# Patient Record
Sex: Female | Born: 1955 | Race: White | Hispanic: No | Marital: Married | Smoking: Never smoker
Health system: Southern US, Community
[De-identification: ages and names within clinical notes are randomized; demographics above are authoritative.]

## PROBLEM LIST (undated history)

## (undated) DIAGNOSIS — M1990 Unspecified osteoarthritis, unspecified site: Secondary | ICD-10-CM

## (undated) HISTORY — DX: Unspecified osteoarthritis, unspecified site: M19.90

---

## 1995-12-20 HISTORY — PX: BREAST BIOPSY: SHX20

## 2004-11-22 ENCOUNTER — Ambulatory Visit: Payer: Self-pay | Admitting: Internal Medicine

## 2004-11-22 IMAGING — MG MM CAD SCREENING MAMMO
1 series · 6 of 6 positions shown · non-contrast
Comparison: none

REASON FOR EXAM: screening
COMMENTS:

PROCEDURE:     MAM - MAM DGTL SCREENING MAMMO W/CAD  - [DATE] [DATE]
RESULT:     No dominant masses or pathologic clustered calcifications
demonstrated.   A stereotactic clip is present in the RIGHT breast.  Changes
consistent with scarring are noted on the RIGHT.

[R MLO · right · 6 of 6 slices shown]
[im 1/6]
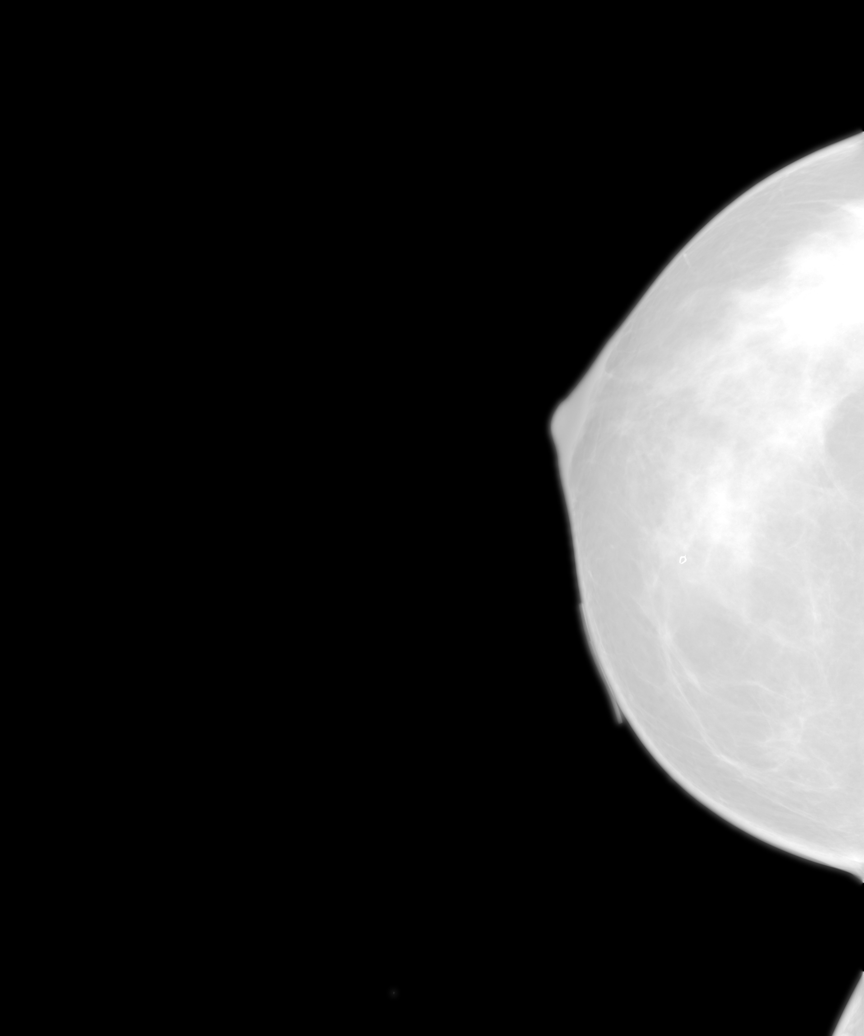
[im 2/6]
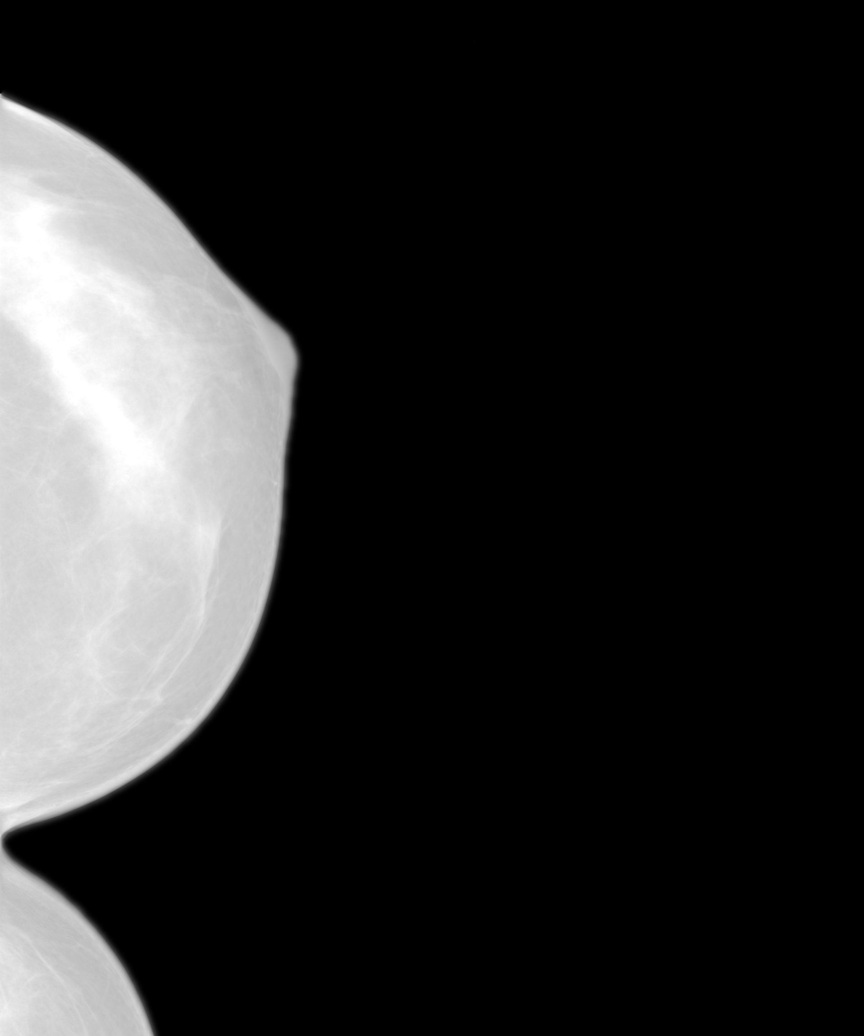
[im 3/6]
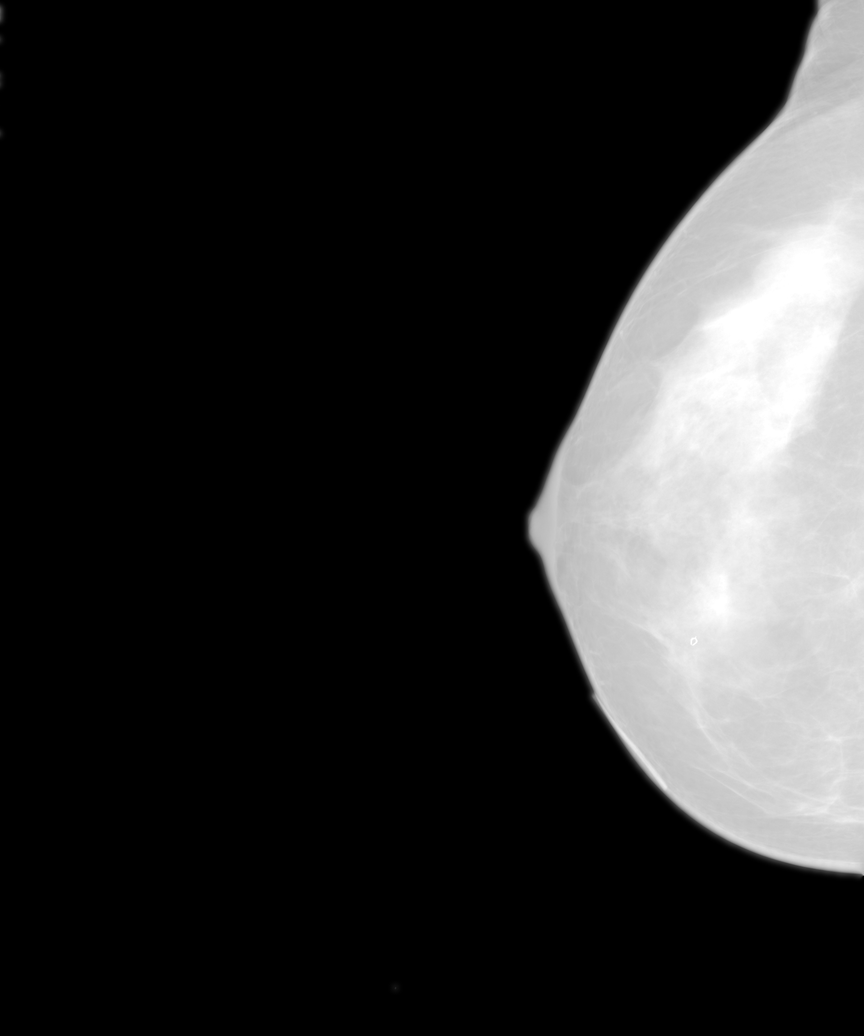
[im 4/6]
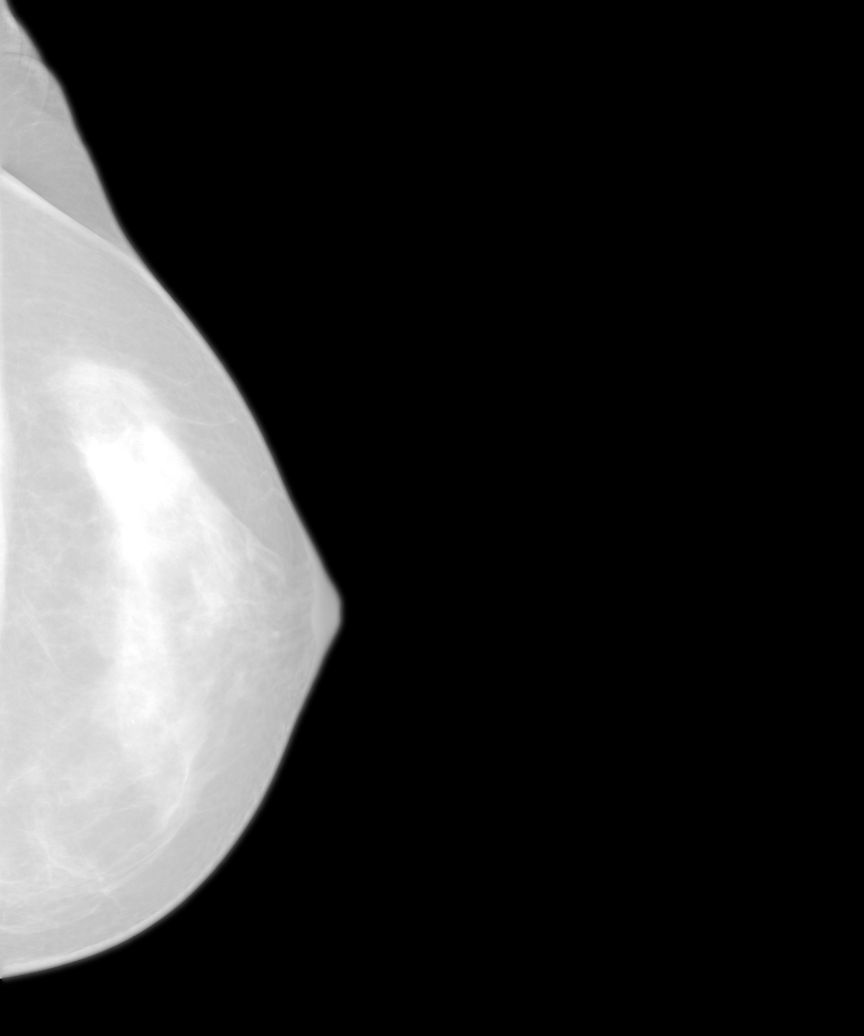
[im 5/6]
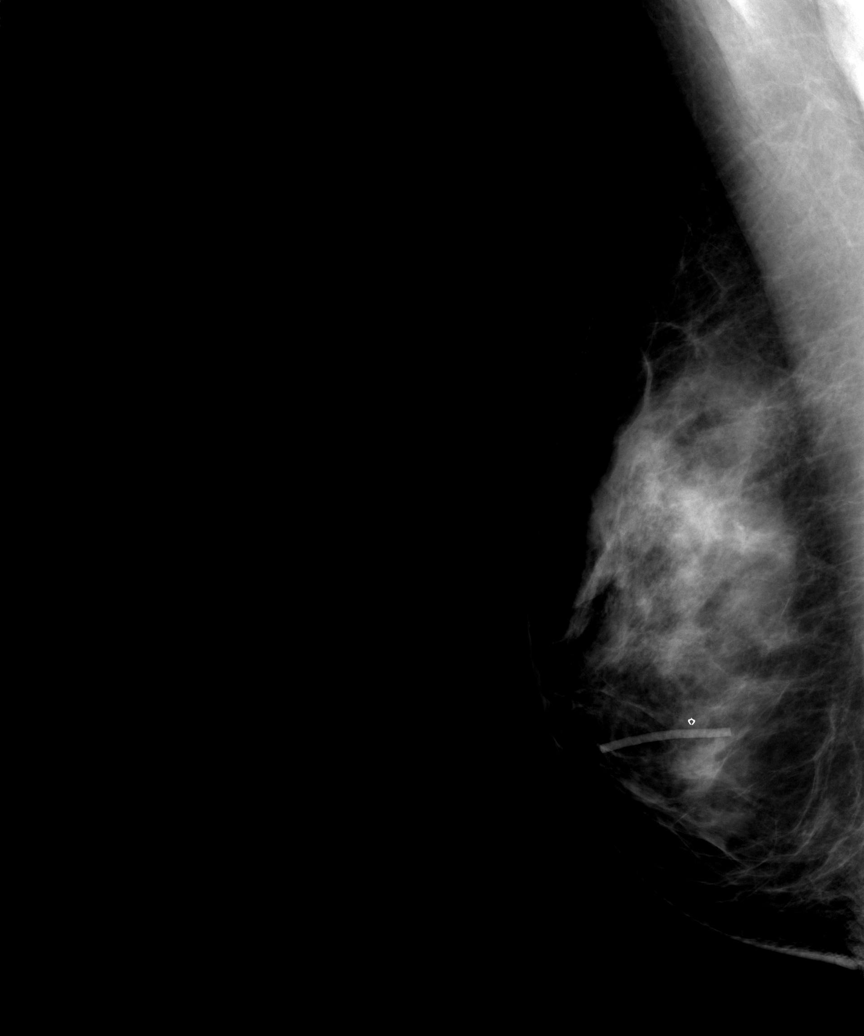
[im 6/6]
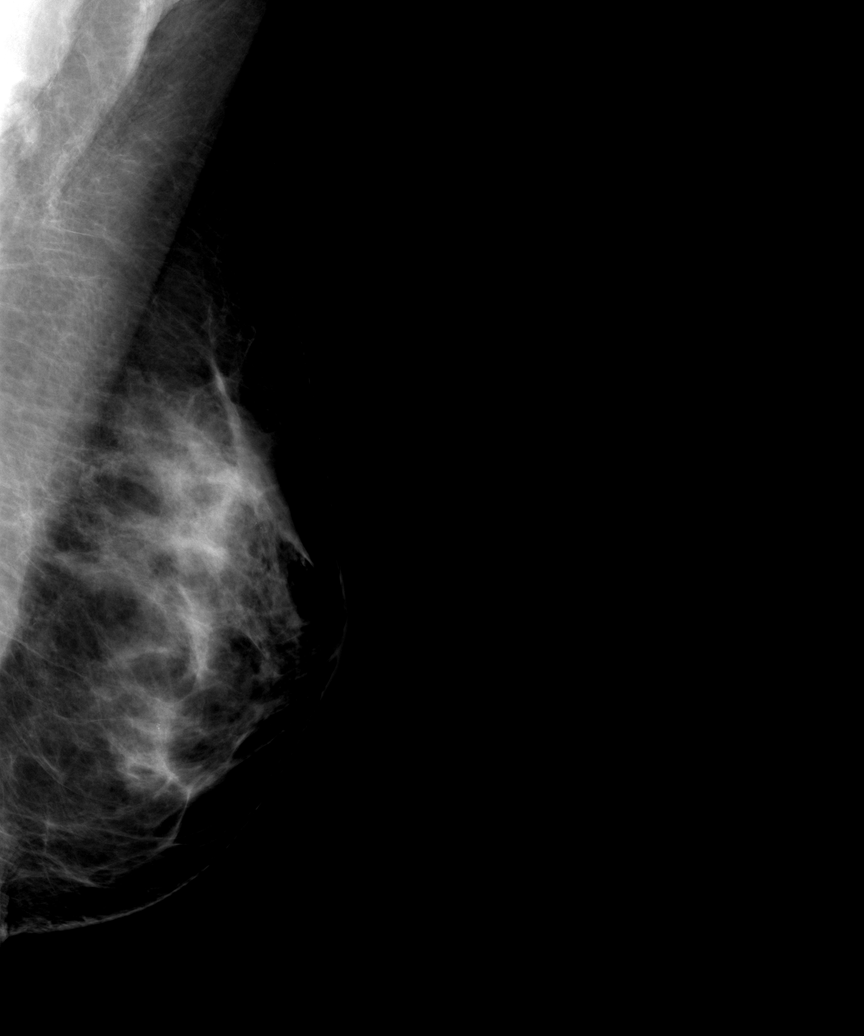

[6 of 6 positions shown; findings below may reference images not displayed]

IMPRESSION: 1)Benign exam.  Yearly follow-up mammogram suggested.

BI-RADS: Category 2-Benign Finding

A NEGATIVE MAMMOGRAM REPORT DOES NOT PRECLUDE BIOPSY OR OTHER EVALUATION OF
A CLINICALLY PALPABLE OR OTHERWISE SUSPICIOUS MASS OR LESION.  BREAST CANCER
MAY NOT BE DETECTED BY MAMMOGRAPHY IN UP TO 10% OF CASES.

## 2005-12-06 ENCOUNTER — Ambulatory Visit: Payer: Self-pay | Admitting: Internal Medicine

## 2005-12-08 ENCOUNTER — Ambulatory Visit: Payer: Self-pay | Admitting: Internal Medicine

## 2006-04-17 LAB — HM DEXA SCAN

## 2007-01-07 ENCOUNTER — Ambulatory Visit: Payer: Self-pay | Admitting: Internal Medicine

## 2008-01-21 ENCOUNTER — Ambulatory Visit: Payer: Self-pay | Admitting: Internal Medicine

## 2008-02-25 ENCOUNTER — Encounter: Admission: RE | Admit: 2008-02-25 | Discharge: 2008-02-25 | Payer: Self-pay | Admitting: Gastroenterology

## 2009-03-03 ENCOUNTER — Ambulatory Visit: Payer: Self-pay | Admitting: Internal Medicine

## 2010-04-14 ENCOUNTER — Ambulatory Visit: Payer: Self-pay | Admitting: Internal Medicine

## 2010-05-19 ENCOUNTER — Ambulatory Visit: Admit: 2010-05-19 | Payer: Self-pay | Admitting: Family Medicine

## 2010-05-19 ENCOUNTER — Ambulatory Visit: Payer: Self-pay | Admitting: Family Medicine

## 2010-05-31 ENCOUNTER — Encounter: Payer: Self-pay | Admitting: Family Medicine

## 2010-05-31 ENCOUNTER — Ambulatory Visit (INDEPENDENT_AMBULATORY_CARE_PROVIDER_SITE_OTHER): Payer: 59 | Admitting: Family Medicine

## 2010-05-31 ENCOUNTER — Other Ambulatory Visit: Payer: Self-pay | Admitting: Family Medicine

## 2010-05-31 DIAGNOSIS — M19049 Primary osteoarthritis, unspecified hand: Secondary | ICD-10-CM

## 2010-05-31 DIAGNOSIS — Z0000 Encounter for general adult medical examination without abnormal findings: Secondary | ICD-10-CM

## 2010-05-31 DIAGNOSIS — E785 Hyperlipidemia, unspecified: Secondary | ICD-10-CM

## 2010-05-31 DIAGNOSIS — Z23 Encounter for immunization: Secondary | ICD-10-CM

## 2010-05-31 LAB — LIPID PANEL
HDL: 59.3 mg/dL (ref >39.00)
Triglycerides: 127 mg/dL (ref 0.0–149.0)

## 2010-05-31 LAB — CBC WITH DIFFERENTIAL/PLATELET
Eosinophils Absolute: 0.1 10*3/uL (ref 0.0–0.7)
HCT: 37.5 % (ref 36.0–46.0)
Lymphocytes Relative: 47.8 % — ABNORMAL HIGH (ref 12.0–46.0)
Lymphs Abs: 2.4 10*3/uL (ref 0.7–4.0)
Monocytes Absolute: 0.4 10*3/uL (ref 0.1–1.0)
Monocytes Relative: 8.1 % (ref 3.0–12.0)
Neutrophils Relative %: 41.3 % — ABNORMAL LOW (ref 43.0–77.0)
RDW: 12.9 % (ref 11.5–14.6)

## 2010-05-31 LAB — BASIC METABOLIC PANEL
Calcium: 9.7 mg/dL (ref 8.4–10.5)
Chloride: 105 mEq/L (ref 96–112)
Potassium: 4.5 mEq/L (ref 3.5–5.1)
Sodium: 141 mEq/L (ref 135–145)

## 2010-05-31 LAB — LDL CHOLESTEROL, DIRECT: Direct LDL: 131.2 mg/dL

## 2010-05-31 LAB — TSH: TSH: 1.83 u[IU]/mL (ref 0.35–5.50)

## 2010-05-31 LAB — HEPATIC FUNCTION PANEL
AST: 32 U/L (ref 0–37)
Bilirubin, Direct: 0.1 mg/dL (ref 0.0–0.3)
Total Bilirubin: 0.6 mg/dL (ref 0.3–1.2)
Total Protein: 6.5 g/dL (ref 6.0–8.3)

## 2010-06-08 NOTE — Assessment & Plan Note (Signed)
Summary: pt to establish/dlo   Vital Signs:  Patient profile:   55 year old female Height:      62.75 inches Weight:      127.25 pounds BMI:     22.80 Temp:     98.3 degrees F oral Pulse rate:   72 / minute Pulse rhythm:   regular BP sitting:   116 / 76  (left arm) Cuff size:   regular  Vitals Entered By: Lewanda Rife LPN (May 31, 2010 11:13 AM) CC: New pt to establish   History of Present Illness: used to see Dr Jacquiline Doe in    was recommended here for primary care   has hx of osteoarthritis in her hands neg rheum workup in the past  is miserable at times -- generally does not take tylenol for it   no bp or chol problems  keeps her wt stable  last health mt exam was 2 full years ago   had a bone density test in past - ? petite frame  it was normal   just had her mam done in oct - all was good   ? last tetnus  is open to one     Preventive Screening-Counseling & Management  Alcohol-Tobacco     Smoking Status: never  Caffeine-Diet-Exercise     Does Patient Exercise: yes      Drug Use:  no.    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 - 2 kids live close by - has 4 grandkids  Never Smoked Alcohol use-no Drug use-no Regular exercise-yes- goes to the gym regularly   Risk Factors: Exercise: yes (05/31/2010)  Risk Factors: Smoking Status: never (05/31/2010)  Past Medical History: Arthritis  Past Surgical History: Denies surgical history  Family History: Maternal grandmother: arthritis mother - died at 6 of uterine cancer , MI - from chemo  father -- died of facial sinus cancer  no HTN or DM   Social History: Occupation:Insurance  Conservator, museum/gallery Married G2P2 - 2 kids live close by - has 4 grandkids  Never Smoked Alcohol use-no Drug use-no Regular exercise-yes- goes to the gym regularly  Occupation:  employed Smoking Status:  never Drug Use:  no Does Patient Exercise:  yes  Review of Systems General:  Denies fatigue, loss of appetite, and malaise. Eyes:  Denies blurring and eye irritation. CV:  Denies chest pain or discomfort, lightheadness, palpitations, and shortness of breath with exertion. Resp:  Denies cough, shortness of breath, and wheezing. GI:  Denies change in bowel habits, indigestion, and nausea. GU:  Denies dysuria and urinary frequency. MS:  Complains of joint pain and stiffness; denies cramps and muscle weakness. Derm:  Denies lesion(s), poor wound healing, and rash. Neuro:  Denies numbness and tingling. Endo:  Denies cold intolerance, excessive thirst, excessive urination, and heat intolerance. Heme:  Denies abnormal bruising and bleeding.  Physical Exam  General:  Well-developed,well-nourished,in no acute distress; alert,appropriate and cooperative throughout examination Head:  normocephalic, atraumatic, and no abnormalities observed.   Eyes:  vision grossly intact, pupils equal, pupils round, and pupils reactive to light.  no conjunctival pallor, injection or icterus  Mouth:  pharynx pink and moist.   Neck:  supple with full rom and  no masses or thyromegally, no JVD or carotid bruit  Lungs:  Normal respiratory effort, chest expands symmetrically. Lungs are clear to auscultation, no crackles or wheezes. Heart:  Normal rate and regular rhythm. S1 and S2 normal without gallop, murmur, click, rub or other extra sounds. Msk:  changes in distal pips of hands of OA with herberden's nodes nl romall fingers  Neurologic:  sensation intact to light touch, gait normal, and DTRs symmetrical and normal.   Skin:  Intact without suspicious lesions or rashes Cervical Nodes:  No lymphadenopathy noted Psych:   normal affect, talkative and pleasant    Impression & Recommendations:  Problem # 1:  OSTEOARTHRITIS, HANDS, BILATERAL (ICD-715.94) Assessment New with distal pip deformity and some pain  not enough to medicate disc overall plan for sympt tx including - activity/ heat/ tylenol and then nsaid if needed handout given from [facility] on OA can consider hand specialist ref at any time if worse  Problem # 2:  Preventive Health Care (ICD-V70.0) Assessment: Comment Only labs today for upcoming health mt exam   Complete Medication List: 1)  Calcium Carbonate-vitamin D 600-400 Mg-unit Tabs (Calcium carbonate-vitamin d) .... One tablet by mouth twice  a day 2)  Fish Oil 1000 Mg Caps (Omega-3 fatty acids) .... Take 1 capsule by mouth once a day 3)  Vitamin C 500 Mg Tabs (Ascorbic acid) .... One tablet by mouth twice  a day. 4)  Vitamin B Complex-c Caps (B complex-c) .... Take 1 capsule by mouth once a day 5)  Biotin ?mg  .... Otc as directed. 6)  Multivitamins Tabs (Multiple vitamin) .... Take 1 tablet by mouth once a day  Other Orders: Venipuncture (16109) TLB-Lipid Panel (80061-LIPID) TLB-BMP (Basic Metabolic Panel-BMET) (80048-METABOL) TLB-Hepatic/Liver Function Pnl (80076-HEPATIC) TLB-TSH (Thyroid Stimulating Hormone) (84443-TSH) TLB-CBC Platelet - w/Differential (85025-CBCD) TD Toxoids IM 7 YR + (60454) Admin 1st Vaccine (09811)  Patient Instructions: 1)  please send to Dr Park Breed for last labs/ note/ physical/ pap / mammogram/ dexa /colonoscopy 2)  the current recommendation for calcium intake is 1200-1500 mg daily with 1000 IU of vitamin D  3)  labs today for health maintentnce  4)  tetnus shot today  5)  please schedule physical (any 30 minute visit ) in the 1-2 months    Orders Added: 1)  Venipuncture [36415] 2)  TLB-Lipid Panel [80061-LIPID] 3)  TLB-BMP (Basic Metabolic Panel-BMET) [80048-METABOL] 4)  TLB-Hepatic/Liver Function Pnl [80076-HEPATIC] 5)  TLB-TSH (Thyroid  Stimulating Hormone) [84443-TSH] 6)  TLB-CBC Platelet - w/Differential [85025-CBCD] 7)  TD Toxoids IM 7 YR + [90714] 8)  Admin 1st Vaccine [90471] 9)  New Patient Level III [99203]   Immunizations Administered:  Tetanus Vaccine:    Vaccine Type: Td    Site: left deltoid    Mfr: Sanofi Pasteur    Dose: 0.5 ml    Route: IM    Given by: Lewanda Rife LPN    Exp. Date: 08/04/2011    Lot #: [id number]    VIS given: 03/04/08 version given May 31, 2010.   Immunizations Administered:  Tetanus Vaccine:    Vaccine Type: Td    Site: left deltoid    Mfr: Sanofi Pasteur    Dose: 0.5 ml    Route: IM    Given by: Lewanda Rife LPN    Exp. Date: 08/04/2011    Lot #: [id number]    VIS given: 03/04/08 version given May 31, 2010.  Current Allergies (reviewed today): No known allergies    Orders  Added: 1)  Venipuncture [36415] 2)  TLB-Lipid Panel [80061-LIPID] 3)  TLB-BMP (Basic Metabolic Panel-BMET) [80048-METABOL] 4)  TLB-Hepatic/Liver Function Pnl [80076-HEPATIC] 5)  TLB-TSH (Thyroid Stimulating Hormone) [84443-TSH] 6)  TLB-CBC Platelet - w/Differential [85025-CBCD] 7)  TD Toxoids IM 7 YR + [90714] 8)  Admin 1st Vaccine [90471] 9)  New Patient Level III [84132]    Preventive Care Screening  Mammogram:    Date:  01/19/2010    Results:  normal   Bone Density:    Date:  04/18/2007    Results:  normal std dev     nl colonoscopy at 49

## 2010-06-08 NOTE — Letter (Signed)
Summary: Patient Questionnaire  Patient Questionnaire   Imported By: Beau Fanny 05/31/2010 15:34:11  _____________________________________________________________________  External Attachment:    Type:   Image     Comment:   External Document

## 2010-06-16 ENCOUNTER — Encounter: Payer: Self-pay | Admitting: Family Medicine

## 2010-06-16 LAB — HM MAMMOGRAPHY

## 2010-07-12 ENCOUNTER — Encounter: Payer: Self-pay | Admitting: Family Medicine

## 2010-07-13 ENCOUNTER — Encounter: Payer: Self-pay | Admitting: Family Medicine

## 2010-07-14 ENCOUNTER — Encounter: Payer: Self-pay | Admitting: Family Medicine

## 2010-08-02 ENCOUNTER — Encounter: Payer: Self-pay | Admitting: Family Medicine

## 2010-08-02 ENCOUNTER — Ambulatory Visit (INDEPENDENT_AMBULATORY_CARE_PROVIDER_SITE_OTHER): Payer: 59 | Admitting: Family Medicine

## 2010-08-02 ENCOUNTER — Other Ambulatory Visit (HOSPITAL_COMMUNITY)
Admission: RE | Admit: 2010-08-02 | Discharge: 2010-08-02 | Disposition: A | Discharge status: Home / Self Care | Payer: 59 | Source: Ambulatory Visit | Attending: Family Medicine | Admitting: Family Medicine

## 2010-08-02 DIAGNOSIS — E7800 Pure hypercholesterolemia, unspecified: Secondary | ICD-10-CM | POA: Insufficient documentation

## 2010-08-02 DIAGNOSIS — Z0000 Encounter for general adult medical examination without abnormal findings: Secondary | ICD-10-CM

## 2010-08-02 DIAGNOSIS — Z1159 Encounter for screening for other viral diseases: Secondary | ICD-10-CM | POA: Insufficient documentation

## 2010-08-02 DIAGNOSIS — Z01419 Encounter for gynecological examination (general) (routine) without abnormal findings: Secondary | ICD-10-CM

## 2010-08-02 DIAGNOSIS — E785 Hyperlipidemia, unspecified: Secondary | ICD-10-CM

## 2010-08-02 LAB — HM PAP SMEAR: HM Pap smear: NORMAL

## 2010-08-02 NOTE — Assessment & Plan Note (Signed)
LDL 131 Will work on low sat fat diet - cut out so much cheese/ shellfish Re check at next PE

## 2010-08-02 NOTE — Assessment & Plan Note (Signed)
Reviewed health habits including diet and exercise and skin cancer prevention Also reviewed health mt list, fam hx and immunizations  Reviewed wellness labs in detail 

## 2010-08-02 NOTE — Patient Instructions (Addendum)
Avoid red meat/ fried foods/ egg yolks/ fatty breakfast meats/ butter, cheese and high fat dairy/ and shellfish  Keep up the good exercise Pap done today  Labs ok

## 2010-08-02 NOTE — Progress Notes (Signed)
  Subjective:    Patient ID: Erika Wall, female    DOB: Nov 06, 1955, 55 y.o.   MRN: [id number]  HPI Here for annual exam and to review chronic health problems  Is doing great --and fine   Wt is down 6 lb with bmi of 21--good Overall labs look good   Pap= has been about 3 ago  No abn paps , no new partners no hpv    Mam was 3/12  Self exam -- no lumps on self exam   colonosc neg 11/09- planned 10 y f/u  TD 2/12  Lipids this check LDL 131 Lab Results  Component Value Date   CHOL 216* 05/31/2010   Lab Results  Component Value Date   HDL 59.30 05/31/2010   No results found for this basename: LDLCALC   Lab Results  Component Value Date   TRIG 127.0 05/31/2010   Lab Results  Component Value Date   CHOLHDL 4 05/31/2010   Rev her diet - low sat fat , some nuts , too much cheese and some butter on bread     Review of Systems  Constitutional: Negative for activity change, appetite change, fatigue and unexpected weight change.  HENT: Negative for congestion and neck pain.   Eyes: Negative for pain and visual disturbance.  Respiratory: Negative for chest tightness and wheezing.   Cardiovascular: Negative.   Gastrointestinal: Negative for abdominal pain, diarrhea, constipation and blood in stool.  Genitourinary: Negative for urgency, frequency, vaginal bleeding, vaginal discharge and pelvic pain.  Musculoskeletal: Negative for myalgias and arthralgias.  Skin: Negative for color change, pallor, rash and wound.  Neurological: Negative for weakness, numbness and headaches.  Hematological: Negative for adenopathy. Does not bruise/bleed easily.  Psychiatric/Behavioral: Negative for dysphoric mood. The patient is not nervous/anxious.        Objective:   Physical Exam  Constitutional: She appears well-developed and well-nourished.  HENT:  Head: Normocephalic and atraumatic.  Right Ear: External ear normal.  Left Ear: External ear normal.  Nose: Nose normal.  Eyes:  Conjunctivae and EOM are normal. Pupils are equal, round, and reactive to light.  Neck: Normal range of motion. Neck supple. No JVD present. Carotid bruit is not present. No thyromegaly present.  Cardiovascular: Normal rate, regular rhythm and normal heart sounds.   Pulmonary/Chest: Effort normal and breath sounds normal.  Abdominal: Soft. Bowel sounds are normal. She exhibits no mass. There is no tenderness.  Genitourinary: Vagina normal and uterus normal. No breast swelling, tenderness, discharge or bleeding. No bleeding around the vagina. No vaginal discharge found.  Musculoskeletal: Normal range of motion. She exhibits no edema and no tenderness.  Lymphadenopathy:    She has no cervical adenopathy.  Neurological: She is alert. She has normal reflexes. Coordination normal.  Skin: Skin is warm and dry. No rash noted. No erythema. No pallor.  Psychiatric: She has a normal mood and affect.          Assessment & Plan:   Past Medical History  Diagnosis Date  . Arthritis    No past surgical history on file.  reports that she has never smoked. She does not have any smokeless tobacco history on file. She reports that she does not drink alcohol or use illicit drugs. family history includes Arthritis in her maternal grandmother; Cancer in her father; Cancer (age of onset:49) in her mother; and Stroke in her mother. No Known Allergies

## 2010-08-02 NOTE — Assessment & Plan Note (Signed)
Exam with pap done No complaints 

## 2011-10-31 ENCOUNTER — Encounter: Payer: Self-pay | Admitting: Family Medicine

## 2011-10-31 ENCOUNTER — Ambulatory Visit (INDEPENDENT_AMBULATORY_CARE_PROVIDER_SITE_OTHER): Payer: 59 | Admitting: Family Medicine

## 2011-10-31 VITALS — BP 120/68 | HR 71 | Temp 98.1°F | Ht 62.0 in | Wt 116.8 lb

## 2011-10-31 DIAGNOSIS — E785 Hyperlipidemia, unspecified: Secondary | ICD-10-CM

## 2011-10-31 DIAGNOSIS — M899 Disorder of bone, unspecified: Secondary | ICD-10-CM

## 2011-10-31 DIAGNOSIS — Z1231 Encounter for screening mammogram for malignant neoplasm of breast: Secondary | ICD-10-CM | POA: Insufficient documentation

## 2011-10-31 DIAGNOSIS — Z0000 Encounter for general adult medical examination without abnormal findings: Secondary | ICD-10-CM

## 2011-10-31 DIAGNOSIS — M8580 Other specified disorders of bone density and structure, unspecified site: Secondary | ICD-10-CM | POA: Insufficient documentation

## 2011-10-31 DIAGNOSIS — R300 Dysuria: Secondary | ICD-10-CM | POA: Insufficient documentation

## 2011-10-31 DIAGNOSIS — N390 Urinary tract infection, site not specified: Secondary | ICD-10-CM

## 2011-10-31 LAB — COMPREHENSIVE METABOLIC PANEL
AST: 25 U/L (ref 0–37)
Albumin: 4.4 g/dL (ref 3.5–5.2)
CO2: 29 mEq/L (ref 19–32)
Chloride: 103 mEq/L (ref 96–112)
GFR: 72.32 mL/min (ref >60.00)
Total Protein: 6.9 g/dL (ref 6.0–8.3)

## 2011-10-31 LAB — CBC WITH DIFFERENTIAL/PLATELET
Basophils Absolute: 0 10*3/uL (ref 0.0–0.1)
Eosinophils Absolute: 0.1 10*3/uL (ref 0.0–0.7)
Hemoglobin: 13.4 g/dL (ref 12.0–15.0)
Lymphocytes Relative: 41.8 % (ref 12.0–46.0)
Lymphs Abs: 2.1 10*3/uL (ref 0.7–4.0)
MCHC: 33.1 g/dL (ref 30.0–36.0)
MCV: 94.5 fl (ref 78.0–100.0)
Monocytes Absolute: 0.5 10*3/uL (ref 0.1–1.0)
Neutro Abs: 2.3 10*3/uL (ref 1.4–7.7)
Neutrophils Relative %: 45.9 % (ref 43.0–77.0)
RDW: 12.9 % (ref 11.5–14.6)
WBC: 5.1 10*3/uL (ref 4.5–10.5)

## 2011-10-31 LAB — POCT UA - MICROSCOPIC ONLY
WBC, Ur, HPF, POC: 0
Yeast, UA: 0

## 2011-10-31 LAB — POCT URINALYSIS DIPSTICK
Nitrite, UA: NEGATIVE
Protein, UA: NEGATIVE
pH, UA: 7

## 2011-10-31 LAB — LIPID PANEL
Cholesterol: 227 mg/dL — ABNORMAL HIGH (ref 0–200)
HDL: 70.1 mg/dL (ref >39.00)
Total CHOL/HDL Ratio: 3

## 2011-10-31 NOTE — Patient Instructions (Addendum)
We will schedule mammogram and dexa at check out  Labs today Keep up the good habits  Keep checking skin and wearing sun block

## 2011-10-31 NOTE — Assessment & Plan Note (Signed)
Scheduled annual screening mammogram Nl breast exam today  Encouraged monthly self exams   

## 2011-10-31 NOTE — Progress Notes (Signed)
Subjective:    Patient ID: Erika Wall, female    DOB: 1956-03-10, 56 y.o.   MRN: [id number]  HPI Here for health maintenance exam and to review chronic medical problems   Is doing well overall  Nothing new   Also urine c/o Gets a feeling occ like she is about to get uti - occ gets a azo  Just a little burning , and feels need to go often  No incontinence  ? If poss overactive bladder Drinks lots of fluids- water  occ coffee - that may cause problems  ua is negative today  Pap 4/12 normal  No hx of abn paps or problems  No ovarian cancer in family   Flu shot - did get in the fall  mammo 3/12-needs that at [facility] Self exam no lumps or changes   Colonoscopy 11/09- told her 10 year recall   dexa 08 with osteopenia  Wants to set that up at [facility]   Patient Active Problem List  Diagnosis  . OSTEOARTHRITIS, HANDS, BILATERAL  . Gynecological examination  . Routine general medical examination at a health care facility  . Hyperlipidemia, mild  . Other screening mammogram  . Osteopenia  . Dysuria   Past Medical History  Diagnosis Date  . Arthritis    No past surgical history on file. History  Substance Use Topics  . Smoking status: Never Smoker   . Smokeless tobacco: Not on file  . Alcohol Use: No   Family History  Problem Relation Age of Onset  . Cancer Mother 87    uterine  . Stroke Mother   . Cancer Father     facial sinus cancer  . Arthritis Maternal Grandmother    No Known Allergies Current Outpatient Prescriptions on File Prior to Visit  Medication Sig Dispense Refill  . Ascorbic Acid (VITAMIN C) 500 MG tablet Take 500 mg by mouth 2 (two) times daily.        Marland Kitchen b complex vitamins capsule Take 1 capsule by mouth daily.        . Biotin 10 MG TABS OTC as directed       . Calcium Carbonate-Vit D-Min 600-400 MG-UNIT TABS 1 tablet by mouth twice a day       . calcium-vitamin D (OSCAL WITH D 500-200) 500-200 MG-UNIT per tablet Take 1 tablet by mouth 2 (two)  times daily.        . fish oil-omega-3 fatty acids 1000 MG capsule Take 1 g by mouth daily.        . Multiple Vitamin (MULTIVITAMIN) capsule Take 1 capsule by mouth daily.        . Misc Natural Products (7-KETO LEAN PO) OTC as directed              Review of Systems Review of Systems  Constitutional: Negative for fever, appetite change, fatigue and unexpected weight change.  Eyes: Negative for pain and visual disturbance.  Respiratory: Negative for cough and shortness of breath.   Cardiovascular: Negative for cp or palpitations    Gastrointestinal: Negative for nausea, diarrhea and constipation.  Genitourinary: Negative for urgency and frequency. pos for occasional dysuria/ neg for blood in urine  Skin: Negative for pallor or rash   Neurological: Negative for weakness, light-headedness, numbness and headaches.  Hematological: Negative for adenopathy. Does not bruise/bleed easily.  Psychiatric/Behavioral: Negative for dysphoric mood. The patient is not nervous/anxious.         Objective:   Physical Exam  Constitutional:  She appears well-developed and well-nourished. No distress.  HENT:  Head: Normocephalic and atraumatic.  Right Ear: External ear normal.  Left Ear: External ear normal.  Nose: Nose normal.  Mouth/Throat: Oropharynx is clear and moist.  Eyes: Conjunctivae and EOM are normal. Pupils are equal, round, and reactive to light. No scleral icterus.  Neck: Normal range of motion. Neck supple. No JVD present. Carotid bruit is not present. No thyromegaly present.  Cardiovascular: Normal rate, regular rhythm, normal heart sounds and intact distal pulses.  Exam reveals no gallop.   Pulmonary/Chest: Effort normal and breath sounds normal. No respiratory distress. She has no wheezes.  Abdominal: Soft. Bowel sounds are normal. She exhibits no distension, no abdominal bruit and no mass. There is no tenderness.  Genitourinary: No breast swelling, tenderness, discharge or bleeding.        Breast exam: No mass, nodules, thickening, tenderness, bulging, retraction, inflamation, nipple discharge or skin changes noted.  No axillary or clavicular LA.  Chaperoned exam.    Musculoskeletal: Normal range of motion. She exhibits no edema and no tenderness.  Lymphadenopathy:    She has no cervical adenopathy.  Neurological: She is alert. She has normal reflexes. No cranial nerve deficit. She exhibits normal muscle tone. Coordination normal.  Skin: Skin is warm and dry. No rash noted. No erythema. No pallor.  Psychiatric: She has a normal mood and affect.          Assessment & Plan:

## 2011-10-31 NOTE — Assessment & Plan Note (Signed)
From dexa 2008 Disc ca and D and exercise- is a runner  No fractures Plan dexa

## 2011-10-31 NOTE — Assessment & Plan Note (Signed)
Reviewed health habits including diet and exercise and skin cancer prevention Also reviewed health mt list, fam hx and immunizations  Wellness labs today 

## 2011-10-31 NOTE — Assessment & Plan Note (Signed)
ua is negative  Disc cutting back coffee to see if that is causing bladder irritation Adv to f/u if not imp

## 2011-10-31 NOTE — Assessment & Plan Note (Signed)
Labs today- overall good diet

## 2011-11-01 LAB — TSH: TSH: 1.75 u[IU]/mL (ref 0.35–5.50)

## 2011-11-01 LAB — LDL CHOLESTEROL, DIRECT: Direct LDL: 119.2 mg/dL

## 2011-11-21 ENCOUNTER — Ambulatory Visit: Payer: 59

## 2011-11-21 ENCOUNTER — Other Ambulatory Visit: Payer: 59

## 2011-11-28 ENCOUNTER — Ambulatory Visit
Admission: RE | Admit: 2011-11-28 | Discharge: 2011-11-28 | Disposition: A | Discharge status: Home / Self Care | Payer: 59 | Source: Ambulatory Visit | Attending: Family Medicine | Admitting: Family Medicine

## 2011-11-28 DIAGNOSIS — M8580 Other specified disorders of bone density and structure, unspecified site: Secondary | ICD-10-CM

## 2011-11-28 DIAGNOSIS — Z1231 Encounter for screening mammogram for malignant neoplasm of breast: Secondary | ICD-10-CM

## 2011-12-22 ENCOUNTER — Other Ambulatory Visit: Payer: 59

## 2011-12-29 ENCOUNTER — Encounter: Payer: 59 | Admitting: Family Medicine

## 2012-04-18 ENCOUNTER — Ambulatory Visit: Payer: 59 | Admitting: Family Medicine

## 2013-01-07 ENCOUNTER — Encounter: Payer: Self-pay | Admitting: Internal Medicine

## 2013-01-07 ENCOUNTER — Ambulatory Visit (INDEPENDENT_AMBULATORY_CARE_PROVIDER_SITE_OTHER): Payer: Managed Care, Other (non HMO) | Admitting: Internal Medicine

## 2013-01-07 VITALS — BP 110/80 | HR 76 | Temp 98.4°F | Ht 62.5 in | Wt 119.5 lb

## 2013-01-07 DIAGNOSIS — M8580 Other specified disorders of bone density and structure, unspecified site: Secondary | ICD-10-CM

## 2013-01-07 DIAGNOSIS — E785 Hyperlipidemia, unspecified: Secondary | ICD-10-CM

## 2013-01-07 DIAGNOSIS — M899 Disorder of bone, unspecified: Secondary | ICD-10-CM

## 2013-01-07 MED ORDER — AMOXICILLIN 875 MG PO TABS: 875.0000 mg | ORAL_TABLET | Freq: Two times a day (BID) | ORAL | Status: DC

## 2013-01-07 MED ORDER — FLUTICASONE PROPIONATE 50 MCG/ACT NA SUSP: 2.0000 | Freq: Every day | NASAL | Status: DC

## 2013-01-07 NOTE — Patient Instructions (Signed)
flonase nasal spray - two sprays each nostril one time per day (in the evening).  Saline nasal spray - flush nose at least two times per day.  mucinex - one tablet in the am.  Robitussin - in the evening.

## 2013-01-11 ENCOUNTER — Encounter: Payer: Self-pay | Admitting: Internal Medicine

## 2013-01-11 NOTE — Progress Notes (Signed)
  Subjective:    Patient ID: Erika Wall, female    DOB: 09-11-1955, 57 y.o.   MRN: [id number]  HPI 57 year old female who comes in today to establish care.  Has previously been seeing Dr Milinda Antis.  States she is doing relatively well.  Has noticed recently having some sinus congestion.  Started last week.  No chest congestion.  No fever.  Head feels full.  Sore throat with increased drainage.  Has a history of some reoccurring sinus issues.  Taking mucinex, allegra and sudafed.  No sob.  Eating and drinking well.  No nausea or vomiting.  No bowel change.  LMP eight years ago.  All normal paps previously.  All normal mammograms.     Past Medical History  Diagnosis Date  . Arthritis     Current Outpatient Prescriptions on File Prior to Visit  Medication Sig Dispense Refill  . Ascorbic Acid (VITAMIN C) 500 MG tablet Take 500 mg by mouth 2 (two) times daily.        Marland Kitchen b complex vitamins capsule Take 1 capsule by mouth daily.        . Biotin 10 MG TABS OTC as directed       . calcium-vitamin D (OSCAL WITH D 500-200) 500-200 MG-UNIT per tablet Take 1 tablet by mouth 2 (two) times daily.        . fish oil-omega-3 fatty acids 1000 MG capsule Take 1 g by mouth daily.        . Multiple Vitamin (MULTIVITAMIN) capsule Take 1 capsule by mouth daily.         No current facility-administered medications on file prior to visit.    Review of Systems Patient denies any headache, lightheadedness or dizziness.  Does report some head fullness and sinus congestion as outlined.  Increased post nasal drainage and sore throat.  No chest pain, tightness or palpatations.  No increased shortness of breath, cough or congestion.  No nausea or vomiting.  No acid reflux.  No abdominal pain or cramping.  No bowel change, such as diarrhea, constipation, BRBPR or melana.  No urine change.  No vaginal problems.        Objective:   Physical Exam Filed Vitals:   01/07/13 1335  BP: 110/80  Pulse: 76  Temp: 98.4 F (29.31  C)   57 year old female in no acute distress.   HEENT:  Nares- clear.  Oropharynx - without lesions. NECK:  Supple.  Nontender.  No audible bruit.  HEART:  Appears to be regular. LUNGS:  No crackles or wheezing audible.  Respirations even and unlabored.  RADIAL PULSE:  Equal bilaterally. ABDOMEN:  Soft, nontender.  Bowel sounds present and normal.  No audible abdominal bruit.    EXTREMITIES:  No increased edema present.  DP pulses palpable and equal bilaterally.          Assessment & Plan:  PROBABLE SINUSITIS.  Treat with amoxicillin 875mg  bid x 10 days.  Saline nasal spray and Flonase as directed.  mucinex in the am and robitussin in the evening.  Stop the allegra temporarily.  Follow.   HEALTH MAINTENANCE.  Review records.  Get her set up for a full physical.  Discuss other health maintenance issues at next visit.    I spent 45 minutes with the patient and more than 50% of the time was spent in consultation regarding the above.

## 2013-01-11 NOTE — Assessment & Plan Note (Signed)
Noted on bone density 2008.  Follow.  Calcium and vitamin D and weight bearing exercise.

## 2013-01-11 NOTE — Assessment & Plan Note (Signed)
Low cholesterol diet and exercise.  Follow.   

## 2013-05-19 ENCOUNTER — Ambulatory Visit (INDEPENDENT_AMBULATORY_CARE_PROVIDER_SITE_OTHER): Payer: Managed Care, Other (non HMO) | Admitting: Internal Medicine

## 2013-05-19 ENCOUNTER — Encounter: Payer: Self-pay | Admitting: Internal Medicine

## 2013-05-19 VITALS — BP 130/90 | HR 66 | Temp 98.1°F | Ht 62.5 in | Wt 123.0 lb

## 2013-05-19 DIAGNOSIS — R300 Dysuria: Secondary | ICD-10-CM

## 2013-05-19 DIAGNOSIS — N390 Urinary tract infection, site not specified: Secondary | ICD-10-CM

## 2013-05-19 LAB — POCT URINALYSIS DIPSTICK
BILIRUBIN UA: NEGATIVE
Glucose, UA: NEGATIVE
Ketones, UA: NEGATIVE
NITRITE UA: NEGATIVE
PROTEIN UA: NEGATIVE
Spec Grav, UA: 1.01
Urobilinogen, UA: 0.2
pH, UA: 7

## 2013-05-19 MED ORDER — NITROFURANTOIN MONOHYD MACRO 100 MG PO CAPS: 100.0000 mg | ORAL_CAPSULE | Freq: Two times a day (BID) | ORAL | Status: DC

## 2013-05-19 NOTE — Progress Notes (Signed)
Pre-visit discussion using our clinic review tool. No additional management support is needed unless otherwise documented below in the visit note.  

## 2013-05-25 ENCOUNTER — Encounter: Payer: Self-pay | Admitting: Internal Medicine

## 2013-05-25 NOTE — Assessment & Plan Note (Signed)
Urinary symptoms as outlined.  Urine dip with trace blood and trace leukocytes.  Treat with macrobid as directed.  Await urine culture results.  Fluids.  Follow.  Notify me or be reevaluated if symptoms change, worsen or do not resolve.

## 2013-05-25 NOTE — Progress Notes (Signed)
  Subjective:    Patient ID: Janice NorrieLynne S Risinger, female    DOB: [birth year], 58 y.o.   MRN: [id number]  Urinary Tract Infection   58 year old female who comes in today as a work in with concerns regarding a urinary tract infection.  States symptoms started today.  Dysuria.  Symptoms at end urination.  Increased frequency.  No hematuria.  No vaginal symptoms.  No abdominal pain or cramping.  Some pressure.  No back pain.       Past Medical History  Diagnosis Date  . Arthritis     Current Outpatient Prescriptions on File Prior to Visit  Medication Sig Dispense Refill  . Ascorbic Acid (VITAMIN C) 500 MG tablet Take 500 mg by mouth 2 (two) times daily.        Marland Kitchen. b complex vitamins capsule Take 1 capsule by mouth daily.        . Biotin 10 MG TABS OTC as directed       . calcium-vitamin D (OSCAL WITH D 500-200) 500-200 MG-UNIT per tablet Take 1 tablet by mouth 2 (two) times daily.        . fexofenadine (ALLEGRA) 180 MG tablet Take 180 mg by mouth daily.      . fish oil-omega-3 fatty acids 1000 MG capsule Take 1 g by mouth daily.        . fluticasone (FLONASE) 50 MCG/ACT nasal spray Place 2 sprays into the nose daily.  16 g  1  . Multiple Vitamin (MULTIVITAMIN) capsule Take 1 capsule by mouth daily.         No current facility-administered medications on file prior to visit.    Review of Systems Urinary symptoms as outlined.  Eating and drinking well.  No nausea or vomiting.  No diarrhea or bowel change.  Increased frequency and dysuria.  No hematuria.  No back pain.  No significant abdominal pain or cramping.       Objective:   Physical Exam  Filed Vitals:   05/19/13 1625  BP: 130/90  Pulse: 66  Temp: 98.1 F (1736.847 C)   [age] year old female in no acute distress.  HEART:  Appears to be regular. LUNGS:  No crackles or wheezing audible.  Respirations even and unlabored. ABDOMEN:  Soft, nontender.  Bowel sounds present and normal.  No audible abdominal bruit.    BACK:  No tenderness to  palpation.  No CVA tenderness.            Assessment & Plan:

## 2013-08-07 ENCOUNTER — Encounter: Payer: Managed Care, Other (non HMO) | Admitting: Internal Medicine

## 2013-09-19 ENCOUNTER — Other Ambulatory Visit (INDEPENDENT_AMBULATORY_CARE_PROVIDER_SITE_OTHER): Payer: Managed Care, Other (non HMO)

## 2013-09-19 DIAGNOSIS — E785 Hyperlipidemia, unspecified: Secondary | ICD-10-CM

## 2013-09-19 DIAGNOSIS — M899 Disorder of bone, unspecified: Secondary | ICD-10-CM

## 2013-09-19 DIAGNOSIS — Z0000 Encounter for general adult medical examination without abnormal findings: Secondary | ICD-10-CM

## 2013-09-19 DIAGNOSIS — M949 Disorder of cartilage, unspecified: Secondary | ICD-10-CM

## 2013-09-19 LAB — COMPREHENSIVE METABOLIC PANEL
ALK PHOS: 49 U/L (ref 39–117)
ALT: 19 U/L (ref 0–35)
AST: 25 U/L (ref 0–37)
Albumin: 4.2 g/dL (ref 3.5–5.2)
BUN: 17 mg/dL (ref 6–23)
CO2: 29 meq/L (ref 19–32)
Calcium: 9.5 mg/dL (ref 8.4–10.5)
Chloride: 106 mEq/L (ref 96–112)
Creatinine, Ser: 0.8 mg/dL (ref 0.4–1.2)
GFR: 75.9 mL/min (ref >60.00)
GLUCOSE: 90 mg/dL (ref 70–99)
Potassium: 5.1 mEq/L (ref 3.5–5.1)
Sodium: 143 mEq/L (ref 135–145)
TOTAL PROTEIN: 6.5 g/dL (ref 6.0–8.3)
Total Bilirubin: 0.6 mg/dL (ref 0.2–1.2)

## 2013-09-19 LAB — CBC WITH DIFFERENTIAL/PLATELET
BASOS PCT: 0.6 % (ref 0.0–3.0)
Basophils Absolute: 0 10*3/uL (ref 0.0–0.1)
EOS ABS: 0.1 10*3/uL (ref 0.0–0.7)
Eosinophils Relative: 1 % (ref 0.0–5.0)
HCT: 39.8 % (ref 36.0–46.0)
Hemoglobin: 13.2 g/dL (ref 12.0–15.0)
LYMPHS ABS: 1.7 10*3/uL (ref 0.7–4.0)
Lymphocytes Relative: 25.4 % (ref 12.0–46.0)
MCHC: 33.2 g/dL (ref 30.0–36.0)
MCV: 95 fl (ref 78.0–100.0)
MONO ABS: 0.6 10*3/uL (ref 0.1–1.0)
MONOS PCT: 8.4 % (ref 3.0–12.0)
NEUTROS PCT: 64.6 % (ref 43.0–77.0)
Neutro Abs: 4.3 10*3/uL (ref 1.4–7.7)
Platelets: 248 10*3/uL (ref 150.0–400.0)
RBC: 4.19 Mil/uL (ref 3.87–5.11)
RDW: 13 % (ref 11.5–15.5)
WBC: 6.7 10*3/uL (ref 4.0–10.5)

## 2013-09-19 LAB — LIPID PANEL
CHOLESTEROL: 214 mg/dL — AB (ref 0–200)
HDL: 66.7 mg/dL (ref >39.00)
LDL Cholesterol: 131 mg/dL — ABNORMAL HIGH (ref 0–99)
NONHDL: 147.3
TRIGLYCERIDES: 82 mg/dL (ref 0.0–149.0)
Total CHOL/HDL Ratio: 3
VLDL: 16.4 mg/dL (ref 0.0–40.0)

## 2013-09-19 LAB — VITAMIN D 25 HYDROXY (VIT D DEFICIENCY, FRACTURES): VITD: 41.98 ng/mL

## 2013-09-23 ENCOUNTER — Other Ambulatory Visit: Payer: Managed Care, Other (non HMO)

## 2013-09-25 ENCOUNTER — Encounter: Payer: Self-pay | Admitting: Internal Medicine

## 2013-09-25 ENCOUNTER — Ambulatory Visit (INDEPENDENT_AMBULATORY_CARE_PROVIDER_SITE_OTHER): Payer: Managed Care, Other (non HMO) | Admitting: Internal Medicine

## 2013-09-25 ENCOUNTER — Other Ambulatory Visit (HOSPITAL_COMMUNITY)
Admission: RE | Admit: 2013-09-25 | Discharge: 2013-09-25 | Disposition: A | Discharge status: Home / Self Care | Payer: Managed Care, Other (non HMO) | Source: Ambulatory Visit | Attending: Internal Medicine | Admitting: Internal Medicine

## 2013-09-25 VITALS — BP 122/80 | HR 84 | Temp 99.3°F | Ht 63.0 in | Wt 123.5 lb

## 2013-09-25 DIAGNOSIS — Z1151 Encounter for screening for human papillomavirus (HPV): Secondary | ICD-10-CM | POA: Insufficient documentation

## 2013-09-25 DIAGNOSIS — Z124 Encounter for screening for malignant neoplasm of cervix: Secondary | ICD-10-CM

## 2013-09-25 DIAGNOSIS — M8580 Other specified disorders of bone density and structure, unspecified site: Secondary | ICD-10-CM

## 2013-09-25 DIAGNOSIS — W57XXXA Bitten or stung by nonvenomous insect and other nonvenomous arthropods, initial encounter: Secondary | ICD-10-CM

## 2013-09-25 DIAGNOSIS — M949 Disorder of cartilage, unspecified: Secondary | ICD-10-CM

## 2013-09-25 DIAGNOSIS — Z1231 Encounter for screening mammogram for malignant neoplasm of breast: Secondary | ICD-10-CM

## 2013-09-25 DIAGNOSIS — Z01419 Encounter for gynecological examination (general) (routine) without abnormal findings: Secondary | ICD-10-CM | POA: Insufficient documentation

## 2013-09-25 DIAGNOSIS — E875 Hyperkalemia: Secondary | ICD-10-CM

## 2013-09-25 DIAGNOSIS — M899 Disorder of bone, unspecified: Secondary | ICD-10-CM

## 2013-09-25 DIAGNOSIS — R509 Fever, unspecified: Secondary | ICD-10-CM

## 2013-09-25 DIAGNOSIS — R350 Frequency of micturition: Secondary | ICD-10-CM

## 2013-09-25 DIAGNOSIS — R0981 Nasal congestion: Secondary | ICD-10-CM

## 2013-09-25 DIAGNOSIS — T148 Other injury of unspecified body region: Secondary | ICD-10-CM

## 2013-09-25 DIAGNOSIS — E785 Hyperlipidemia, unspecified: Secondary | ICD-10-CM

## 2013-09-25 DIAGNOSIS — J3489 Other specified disorders of nose and nasal sinuses: Secondary | ICD-10-CM

## 2013-09-25 DIAGNOSIS — R300 Dysuria: Secondary | ICD-10-CM

## 2013-09-25 LAB — POCT URINALYSIS DIPSTICK
Bilirubin, UA: NEGATIVE
GLUCOSE UA: NEGATIVE
KETONES UA: NEGATIVE
NITRITE UA: NEGATIVE
Protein, UA: NEGATIVE
SPEC GRAV UA: 1.015
UROBILINOGEN UA: 0.2
pH, UA: 7

## 2013-09-25 LAB — CBC WITH DIFFERENTIAL/PLATELET
BASOS ABS: 0 10*3/uL (ref 0.0–0.1)
BASOS PCT: 0.4 % (ref 0.0–3.0)
EOS ABS: 0.1 10*3/uL (ref 0.0–0.7)
Eosinophils Relative: 0.6 % (ref 0.0–5.0)
HCT: 36.6 % (ref 36.0–46.0)
Hemoglobin: 12.4 g/dL (ref 12.0–15.0)
Lymphocytes Relative: 20.5 % (ref 12.0–46.0)
Lymphs Abs: 2 10*3/uL (ref 0.7–4.0)
MCHC: 33.7 g/dL (ref 30.0–36.0)
MCV: 92.7 fl (ref 78.0–100.0)
MONO ABS: 1.4 10*3/uL — AB (ref 0.1–1.0)
Monocytes Relative: 14.4 % — ABNORMAL HIGH (ref 3.0–12.0)
Neutro Abs: 6.1 10*3/uL (ref 1.4–7.7)
Neutrophils Relative %: 64.1 % (ref 43.0–77.0)
Platelets: 249 10*3/uL (ref 150.0–400.0)
RBC: 3.95 Mil/uL (ref 3.87–5.11)
RDW: 12.9 % (ref 11.5–15.5)
WBC: 9.5 10*3/uL (ref 4.0–10.5)

## 2013-09-25 LAB — POTASSIUM: Potassium: 4.3 mEq/L (ref 3.5–5.1)

## 2013-09-25 MED ORDER — DOXYCYCLINE HYCLATE 100 MG PO TABS: 100.0000 mg | ORAL_TABLET | Freq: Two times a day (BID) | ORAL | Status: DC

## 2013-09-25 NOTE — Progress Notes (Signed)
Pre visit review using our clinic review tool, if applicable. No additional management support is needed unless otherwise documented below in the visit note. 

## 2013-09-27 LAB — URINE CULTURE: Colony Count: >=100000

## 2013-09-29 ENCOUNTER — Encounter: Payer: Self-pay | Admitting: Internal Medicine

## 2013-09-29 NOTE — Progress Notes (Signed)
Subjective:    Patient ID: Erika NorrieLynne S Hupp, female    DOB: 09-Nov-1955, 58 y.o.   MRN: [id number]  Urinary Tract Infection   58 year old female who comes in today for her physical.  States she is doing relatively well.  States that 4 weeks ago, she removed a tick from her inner thigh.  She reports that over this past week, she has noticed some shaking chills, nausea and most recently developed some head congestion.  Her low back and hips ache.  Bilateral ear pain x 2 days.  Minimal chest congestion.   No sob.  Eating and drinking well.   No bowel change.  LMP eight years ago.  All normal paps previously.  All normal mammograms.  Also reports increased urinary frequency over the last two weeks.  .  Some odor change with minimal dysuria.  No hematuria.  No abdominal pain or cramping.     Past Medical History  Diagnosis Date  . Arthritis     Current Outpatient Prescriptions on File Prior to Visit  Medication Sig Dispense Refill  . Ascorbic Acid (VITAMIN C) 500 MG tablet Take 500 mg by mouth 2 (two) times daily.        Marland Kitchen. b complex vitamins capsule Take 1 capsule by mouth daily.        . calcium-vitamin D (OSCAL WITH D 500-200) 500-200 MG-UNIT per tablet Take 1 tablet by mouth 2 (two) times daily.        . fexofenadine (ALLEGRA) 180 MG tablet Take 180 mg by mouth as needed for allergies.       . fish oil-omega-3 fatty acids 1000 MG capsule Take 1 g by mouth daily.        . fluticasone (FLONASE) 50 MCG/ACT nasal spray Place 2 sprays into the nose daily.  16 g  1  . Multiple Vitamin (MULTIVITAMIN) capsule Take 1 capsule by mouth daily.         No current facility-administered medications on file prior to visit.    Review of Systems Patient denies any headache, lightheadedness or dizziness.  Does report some head fullness and sinus congestion as outlined.  Increased post nasal drainage and sore throat.  No chest pain, tightness or palpatations.  No increased shortness of breath, cough or congestion.   No nausea or vomiting.  No acid reflux.  No abdominal pain or cramping.  No bowel change, such as diarrhea, constipation, BRBPR or melana.  No urine change.  No vaginal problems.        Objective:   Physical Exam  Filed Vitals:   09/25/13 1001  BP: 122/80  Pulse: 84  Temp: 99.3 F (6737.1074 C)   58 year old female in no acute distress.   HEENT:  Nares- clear.  Oropharynx - without lesions. NECK:  Supple.  Nontender.  No audible bruit.  HEART:  Appears to be regular. LUNGS:  No crackles or wheezing audible.  Respirations even and unlabored.  RADIAL PULSE:  Equal bilaterally.    BREASTS:  No nipple discharge or nipple retraction present.  Could not appreciate any distinct nodules or axillary adenopathy.  ABDOMEN:  Soft, nontender.  Bowel sounds present and normal.  No audible abdominal bruit.  BACK:  Non tender.  No CVA tenderness.   GU:  Normal external genitalia.  Vaginal vault without lesions.  Cervix identified.  Pap performed. Could not appreciate any adnexal masses or tenderness.   RECTAL:  Heme negative.   EXTREMITIES:  No increased  edema present.  DP pulses palpable and equal bilaterally.          Assessment & Plan:  HEALTH MAINTENANCE.  Physical today.  Has mammogram scheduled for 7/15.     I spent 40 minutes with the patient and more than 50% of the time was spent in consultation regarding the above.

## 2013-09-30 ENCOUNTER — Encounter: Payer: Self-pay | Admitting: Internal Medicine

## 2013-09-30 LAB — CYTOLOGY - PAP

## 2013-09-30 NOTE — Assessment & Plan Note (Signed)
Noted on bone density 2008.  Follow.  Calcium and vitamin D and weight bearing exercise.  Will need f/u bone density.

## 2013-09-30 NOTE — Assessment & Plan Note (Signed)
Pelvic and pap today.  

## 2013-09-30 NOTE — Assessment & Plan Note (Signed)
Exam as outlined.  Urinalysis reveals a large amount of blood with large leukocytes.  Place on doxycycline as outlined.  Follow.

## 2013-09-30 NOTE — Assessment & Plan Note (Signed)
Has developed head congestion.  Taking mucinex.  Continue.  Saline nasal spray and nasacort as directed.  Being placed on doxycycline for possible tick infection, which should cover for possible sinus infection.

## 2013-09-30 NOTE — Assessment & Plan Note (Signed)
Has mammogram scheduled for 7/15.

## 2013-09-30 NOTE — Assessment & Plan Note (Signed)
Low cholesterol diet and exercise.  Follow.   

## 2013-09-30 NOTE — Assessment & Plan Note (Signed)
Removal of recent tick.  With fever, some aching and chills.  Will treat with doxycycline as outlined.  Should cover the other infections.  Follow.

## 2013-11-06 ENCOUNTER — Ambulatory Visit (INDEPENDENT_AMBULATORY_CARE_PROVIDER_SITE_OTHER): Payer: Managed Care, Other (non HMO) | Admitting: Internal Medicine

## 2013-11-06 ENCOUNTER — Encounter: Payer: Self-pay | Admitting: Internal Medicine

## 2013-11-06 VITALS — BP 110/70 | HR 62 | Temp 98.8°F | Ht 63.0 in | Wt 122.8 lb

## 2013-11-06 DIAGNOSIS — E785 Hyperlipidemia, unspecified: Secondary | ICD-10-CM

## 2013-11-06 DIAGNOSIS — R300 Dysuria: Secondary | ICD-10-CM

## 2013-11-06 DIAGNOSIS — R319 Hematuria, unspecified: Secondary | ICD-10-CM

## 2013-11-06 NOTE — Progress Notes (Signed)
Pre visit review using our clinic review tool, if applicable. No additional management support is needed unless otherwise documented below in the visit note. 

## 2013-11-07 LAB — URINALYSIS, ROUTINE W REFLEX MICROSCOPIC
BILIRUBIN URINE: NEGATIVE
Ketones, ur: NEGATIVE
Leukocytes, UA: NEGATIVE
NITRITE: NEGATIVE
Specific Gravity, Urine: <=1.005 — AB (ref 1.000–1.030)
TOTAL PROTEIN, URINE-UPE24: NEGATIVE
URINE GLUCOSE: NEGATIVE
UROBILINOGEN UA: 0.2 (ref 0.0–1.0)
WBC UA: NONE SEEN (ref 0–?)
pH: 6 (ref 5.0–8.0)

## 2013-11-09 ENCOUNTER — Encounter: Payer: Self-pay | Admitting: Internal Medicine

## 2013-11-09 NOTE — Assessment & Plan Note (Addendum)
No symptoms now.  Resolved.  Will recheck urinalysis to confirm clear.

## 2013-11-09 NOTE — Assessment & Plan Note (Signed)
Low cholesterol diet and exercise.  Follow.   

## 2013-11-09 NOTE — Progress Notes (Signed)
  Subjective:    Patient ID: Erika Wall, female    DOB: 03/18/1956, 58 y.o.   MRN: [id number]  HPI 58 year old female who comes in today for a scheduled follow up.   States she is doing better.   Previously was seen with concerns over UTI, respiratory infection and a tick bite.  See last note for details.  Treated with doxycycline.  Better now.  No urinary symptoms.  No increased cough or congestion.  Breathing is stable.  No sob.  No nausea or vomiting.  No bowel change.  Overall feels good.     Past Medical History  Diagnosis Date  . Arthritis     Current Outpatient Prescriptions on File Prior to Visit  Medication Sig Dispense Refill  . Ascorbic Acid (VITAMIN C) 500 MG tablet Take 500 mg by mouth 2 (two) times daily.        Marland Kitchen. b complex vitamins capsule Take 1 capsule by mouth daily.        . Biotin 1000 MCG tablet Take 1,000 mcg by mouth daily.      . calcium-vitamin D (OSCAL WITH D 500-200) 500-200 MG-UNIT per tablet Take 1 tablet by mouth 2 (two) times daily.        . fexofenadine (ALLEGRA) 180 MG tablet Take 180 mg by mouth as needed for allergies.       . fish oil-omega-3 fatty acids 1000 MG capsule Take 1 g by mouth daily.        . fluticasone (FLONASE) 50 MCG/ACT nasal spray Place 2 sprays into the nose daily.  16 g  1  . Multiple Vitamin (MULTIVITAMIN) capsule Take 1 capsule by mouth daily.         No current facility-administered medications on file prior to visit.    Review of Systems Patient denies any headache, lightheadedness or dizziness.  No increased congestion.  No chest pain, tightness or palpatations.  No increased shortness of breath, cough or congestion.  No nausea or vomiting.  No acid reflux.  No abdominal pain or cramping.  No bowel change, such as diarrhea, constipation, BRBPR or melana.  No urine change.  No vaginal problems.        Objective:   Physical Exam  Filed Vitals:   11/06/13 1401  BP: 110/70  Pulse: 62  Temp: 98.8 F (37.1 C)   58 year  old female in no acute distress.   HEENT:  Nares- clear.  Oropharynx - without lesions. NECK:  Supple.  Nontender.  No audible bruit.  HEART:  Appears to be regular. LUNGS:  No crackles or wheezing audible.  Respirations even and unlabored.  RADIAL PULSE:  Equal bilaterally. ABDOMEN:  Soft, nontender.  Bowel sounds present and normal.  No audible abdominal bruit.    EXTREMITIES:  No increased edema present.  DP pulses palpable and equal bilaterally.          Assessment & Plan:  HEALTH MAINTENANCE.  Physical 09/25/13.   She will reschedule her mammogram.

## 2013-11-14 ENCOUNTER — Ambulatory Visit: Payer: Managed Care, Other (non HMO) | Admitting: Internal Medicine

## 2014-06-05 ENCOUNTER — Encounter: Payer: Self-pay | Admitting: Family Medicine

## 2014-06-05 ENCOUNTER — Ambulatory Visit (INDEPENDENT_AMBULATORY_CARE_PROVIDER_SITE_OTHER): Payer: 59 | Admitting: Family Medicine

## 2014-06-05 VITALS — BP 126/82 | HR 105 | Temp 100.1°F | Wt 125.0 lb

## 2014-06-05 DIAGNOSIS — R059 Cough, unspecified: Secondary | ICD-10-CM

## 2014-06-05 DIAGNOSIS — R509 Fever, unspecified: Secondary | ICD-10-CM

## 2014-06-05 DIAGNOSIS — R05 Cough: Secondary | ICD-10-CM

## 2014-06-05 DIAGNOSIS — J209 Acute bronchitis, unspecified: Secondary | ICD-10-CM

## 2014-06-05 LAB — POCT INFLUENZA A/B
Influenza A, POC: NEGATIVE
Influenza B, POC: NEGATIVE

## 2014-06-05 MED ORDER — GUAIFENESIN-CODEINE 100-10 MG/5ML PO SYRP: 5.0000 mL | ORAL_SOLUTION | Freq: Every evening | ORAL | Status: DC | PRN

## 2014-06-05 MED ORDER — AZITHROMYCIN 250 MG PO TABS: ORAL_TABLET | ORAL | Status: DC

## 2014-06-05 MED ORDER — ALBUTEROL SULFATE HFA 108 (90 BASE) MCG/ACT IN AERS: 2.0000 | INHALATION_SPRAY | Freq: Four times a day (QID) | RESPIRATORY_TRACT | Status: DC | PRN

## 2014-06-05 NOTE — Progress Notes (Signed)
BP 126/82 mmHg  Pulse 105  Temp(Src) 100.1 F (37.8 C) (Oral)  Wt 125 lb (56.7 kg)  SpO2 94%  LMP 04/17/2005   CC: cough, fever  Subjective:    Patient ID: Janice NorrieLynne S Golberg, female    DOB: 1955-06-24, 59 y.o.   MRN: [id number]  HPI: Janice NorrieLynne S Hannig is a 59 y.o. female presenting on 06/05/2014 for Cough   Persistent cough over last week, rattly cough but not productive, associated with coughing fits, now painful to cough in upper chest, also with low grade fever.  Chest congestion ongoing over last week. Tmax Monday 101.3. Has not checked temp since then. Occasional dyspnea but mainly whet  No sore throat, ear or tooth pain sinus pressure or headaches. No body aches, myalgias, nausea.   Taking mucinex during the day as well as some prescription cough syrup at night time.  Husband has been sick as well recently. He is now feeling better. No h/o asthma or COPD. H/o bronchitis in past but not recently. No smokers at home. Did not receive flu shot this year.   Relevant past medical, surgical, family and social history reviewed and updated as indicated. Interim medical history since our last visit reviewed. Allergies and medications reviewed and updated. Current Outpatient Prescriptions on File Prior to Visit  Medication Sig  . Biotin 1000 MCG tablet Take 1,000 mcg by mouth daily.  . fluticasone (FLONASE) 50 MCG/ACT nasal spray Place 2 sprays into the nose daily.  . Multiple Vitamin (MULTIVITAMIN) capsule Take 1 capsule by mouth daily.    . Ascorbic Acid (VITAMIN C) 500 MG tablet Take 500 mg by mouth 2 (two) times daily.    Marland Kitchen. b complex vitamins capsule Take 1 capsule by mouth daily.    . calcium-vitamin D (OSCAL WITH D 500-200) 500-200 MG-UNIT per tablet Take 1 tablet by mouth 2 (two) times daily.    . fexofenadine (ALLEGRA) 180 MG tablet Take 180 mg by mouth as needed for allergies.   . fish oil-omega-3 fatty acids 1000 MG capsule Take 1 g by mouth daily.     No current  facility-administered medications on file prior to visit.    Review of Systems Per HPI unless specifically indicated above     Objective:    BP 126/82 mmHg  Pulse 105  Temp(Src) 100.1 F (37.8 C) (Oral)  Wt 125 lb (56.7 kg)  SpO2 94%  LMP 04/17/2005  Wt Readings from Last 3 Encounters:  06/05/14 125 lb (56.7 kg)  11/06/13 122 lb 12 oz (55.679 kg)  09/25/13 123 lb 8 oz (56.019 kg)    Physical Exam  Constitutional: She appears well-developed and well-nourished. No distress.  HENT:  Head: Normocephalic and atraumatic.  Right Ear: Hearing, tympanic membrane, external ear and ear canal normal.  Left Ear: Hearing, tympanic membrane, external ear and ear canal normal.  Nose: No mucosal edema or rhinorrhea. Right sinus exhibits no maxillary sinus tenderness and no frontal sinus tenderness. Left sinus exhibits no maxillary sinus tenderness and no frontal sinus tenderness.  Mouth/Throat: Uvula is midline, oropharynx is clear and moist and mucous membranes are normal. No oropharyngeal exudate, posterior oropharyngeal edema, posterior oropharyngeal erythema or tonsillar abscesses.  Eyes: Conjunctivae and EOM are normal. Pupils are equal, round, and reactive to light. No scleral icterus.  Neck: Normal range of motion. Neck supple.  Cardiovascular: Normal rate, regular rhythm, normal heart sounds and intact distal pulses.   No murmur heard. Pulmonary/Chest: Effort normal. No respiratory distress. She has  no decreased breath sounds. She has wheezes (polysymphonic diffusely). She has rhonchi (faint scattered). She has no rales.  Lymphadenopathy:    She has no cervical adenopathy.  Skin: Skin is warm and dry. No rash noted.  Nursing note and vitals reviewed.  Results for orders placed or performed in visit on 06/05/14  POCT Influenza A/B  Result Value Ref Range   Influenza A, POC Negative    Influenza B, POC Negative       Assessment & Plan:   Problem List Items Addressed This Visit      Acute bronchitis with bronchospasm - Primary    Treat with albuterol inhaler 2 puffs TID scheduled for next 2 days and zpack.  May use codeine cough syrup at night time. Push fluids and rest. Update Mon/Tues if not better for CXR and prednisone course. Discussed red flags to seek care over weekend. Pt agrees with plan.       Other Visit Diagnoses    Cough        Relevant Orders    POCT Influenza A/B (Completed)    Other specified fever        Relevant Orders    POCT Influenza A/B (Completed)        Follow up plan: Return if symptoms worsen or fail to improve.

## 2014-06-05 NOTE — Assessment & Plan Note (Signed)
Treat with albuterol inhaler 2 puffs TID scheduled for next 2 days and zpack.  May use codeine cough syrup at night time. Push fluids and rest. Update Mon/Tues if not better for CXR and prednisone course. Discussed red flags to seek care over weekend. Pt agrees with plan.

## 2014-06-05 NOTE — Patient Instructions (Signed)
I think you have reactive airway flare with bronchitis. Treat with zpack, albuterol inhaler, and codeine cough syrup at night time. May conitnue mucinex. If not improving as expected, update us on Mon or Tues for xray and possible prednisone course. Watch for fever> 101, or worsening productive cough.

## 2014-06-05 NOTE — Progress Notes (Signed)
Pre visit review using our clinic review tool, if applicable. No additional management support is needed unless otherwise documented below in the visit note. 

## 2014-08-25 ENCOUNTER — Other Ambulatory Visit: Payer: Self-pay

## 2014-08-25 DIAGNOSIS — Z1231 Encounter for screening mammogram for malignant neoplasm of breast: Secondary | ICD-10-CM

## 2014-09-08 ENCOUNTER — Ambulatory Visit
Admission: RE | Admit: 2014-09-08 | Discharge: 2014-09-08 | Disposition: A | Discharge status: Home / Self Care | Payer: 59 | Source: Ambulatory Visit | Attending: Internal Medicine | Admitting: Internal Medicine

## 2014-09-08 DIAGNOSIS — Z1231 Encounter for screening mammogram for malignant neoplasm of breast: Secondary | ICD-10-CM | POA: Diagnosis present

## 2014-09-09 ENCOUNTER — Encounter: Payer: Self-pay | Admitting: Internal Medicine

## 2014-09-09 DIAGNOSIS — Z0000 Encounter for general adult medical examination without abnormal findings: Secondary | ICD-10-CM | POA: Insufficient documentation

## 2014-10-08 ENCOUNTER — Encounter: Payer: Managed Care, Other (non HMO) | Admitting: Internal Medicine

## 2014-10-12 ENCOUNTER — Encounter: Payer: 59 | Admitting: Internal Medicine

## 2015-01-22 ENCOUNTER — Encounter: Payer: 59 | Admitting: Internal Medicine

## 2015-02-22 ENCOUNTER — Ambulatory Visit (INDEPENDENT_AMBULATORY_CARE_PROVIDER_SITE_OTHER): Payer: 59 | Admitting: Internal Medicine

## 2015-02-22 ENCOUNTER — Encounter: Payer: Self-pay | Admitting: Internal Medicine

## 2015-02-22 VITALS — BP 122/78 | HR 65 | Temp 98.1°F | Resp 18 | Ht 62.5 in | Wt 122.5 lb

## 2015-02-22 DIAGNOSIS — Z0000 Encounter for general adult medical examination without abnormal findings: Secondary | ICD-10-CM

## 2015-02-22 DIAGNOSIS — M8580 Other specified disorders of bone density and structure, unspecified site: Secondary | ICD-10-CM

## 2015-02-22 DIAGNOSIS — E785 Hyperlipidemia, unspecified: Secondary | ICD-10-CM

## 2015-02-22 NOTE — Assessment & Plan Note (Signed)
Last bone density 2013 - osteopenia.  Discussed weight bearing exercise.  Calcium and vitamin d.  Follow.

## 2015-02-22 NOTE — Assessment & Plan Note (Signed)
Physical today 02/22/15.  mammogram 09/09/14 - birads I.  PAP 09/25/13 - negative with negative HPV.  Colonoscopy 02/28/08 - normal.  Recommended f/u in 10 years.  IFOB.

## 2015-02-22 NOTE — Progress Notes (Signed)
Patient ID: Erika Wall, female   DOB: 26-Sep-1955, 59 y.o.   MRN: [id number]   Subjective:    Patient ID: Erika Wall, female    DOB: Mar 02, 1956, 60 y.o.   MRN: [id number]  HPI  Patient presents for her physical exam.  She tries to stay active.  We discussed diet and exercise.  She has adjusted her diet.  Bread bloats her.  When she avoids - no problems.  Bowels stable.  No cardiac symptoms with increased activity or exertion.  No sob.  No acid reflux.  No abdominal pain or cramping.     Past Medical History  Diagnosis Date  . Arthritis    Past Surgical History  Procedure Laterality Date  . Breast biopsy Right 12/20/1995   Family History  Problem Relation Age of Onset  . Cancer Mother 76    uterine  . Stroke Mother   . Cancer Father     facial sinus cancer  . Arthritis Maternal Grandmother    Social History   Social History  . Marital Status: Married    Spouse Name: N/A  . Number of Children: N/A  . Years of Education: N/A   Occupational History  . Marine scientist    Social History Main Topics  . Smoking status: Never Smoker   . Smokeless tobacco: Never Used  . Alcohol Use: No  . Drug Use: No  . Sexual Activity: Not Asked   Other Topics Concern  . None   Social History Narrative    Outpatient Encounter Prescriptions as of 02/22/2015  Medication Sig  . Biotin 1000 MCG tablet Take 1,000 mcg by mouth daily.  . fexofenadine (ALLEGRA) 180 MG tablet Take 180 mg by mouth as needed for allergies.   . fish oil-omega-3 fatty acids 1000 MG capsule Take 1 g by mouth daily.    . fluticasone (FLONASE) 50 MCG/ACT nasal spray Place 2 sprays into the nose daily.  . Multiple Vitamin (MULTIVITAMIN) capsule Take 1 capsule by mouth daily.    . [DISCONTINUED] albuterol (PROVENTIL HFA;VENTOLIN HFA) 108 (90 BASE) MCG/ACT inhaler Inhale 2 puffs into the lungs every 6 (six) hours as needed for wheezing or shortness of breath.  . [DISCONTINUED] Ascorbic Acid (VITAMIN C) 500 MG  tablet Take 500 mg by mouth 2 (two) times daily.    . [DISCONTINUED] azithromycin (ZITHROMAX Z-PAK) 250 MG tablet Two on day 1 followed by one daily for 4 days for total of 5 days, PO  . [DISCONTINUED] b complex vitamins capsule Take 1 capsule by mouth daily.    . [DISCONTINUED] calcium-vitamin D (OSCAL WITH D 500-200) 500-200 MG-UNIT per tablet Take 1 tablet by mouth 2 (two) times daily.    . [DISCONTINUED] guaiFENesin-codeine (ROBITUSSIN AC) 100-10 MG/5ML syrup Take 5 mLs by mouth at bedtime as needed.   No facility-administered encounter medications on file as of 02/22/2015.    Review of Systems  Constitutional: Negative for appetite change and unexpected weight change.  HENT: Negative for congestion and sinus pressure.   Eyes: Negative for pain and visual disturbance.  Respiratory: Negative for cough, chest tightness and shortness of breath.   Cardiovascular: Negative for chest pain, palpitations and leg swelling.  Gastrointestinal: Negative for nausea, vomiting, abdominal pain and diarrhea.  Genitourinary: Negative for dysuria and difficulty urinating.  Musculoskeletal: Negative for back pain and joint swelling.  Skin: Negative for color change and rash.  Neurological: Negative for dizziness and headaches.  Hematological: Negative for adenopathy. Does not bruise/bleed easily.  Psychiatric/Behavioral: Negative for dysphoric mood and agitation.       Objective:     Blood pressure rechecked by me:  126/78  Physical Exam  Constitutional: She is oriented to person, place, and time. She appears well-developed and well-nourished.  HENT:  Nose: Nose normal.  Mouth/Throat: Oropharynx is clear and moist.  Eyes: Right eye exhibits no discharge. Left eye exhibits no discharge. No scleral icterus.  Neck: Neck supple. No thyromegaly present.  Cardiovascular: Normal rate and regular rhythm.   Pulmonary/Chest: Breath sounds normal. No accessory muscle usage. No tachypnea. No respiratory  distress. She has no decreased breath sounds. She has no wheezes. She has no rhonchi. Right breast exhibits no inverted nipple, no mass, no nipple discharge and no tenderness (no axillary adenopathy). Left breast exhibits no inverted nipple, no mass, no nipple discharge and no tenderness (no axilarry adenopathy).  Abdominal: Soft. Bowel sounds are normal. There is no tenderness.  Genitourinary:  Not performed.   Musculoskeletal: She exhibits no edema or tenderness.  Lymphadenopathy:    She has no cervical adenopathy.  Neurological: She is alert and oriented to person, place, and time.  Skin: Skin is warm. No rash noted.  Psychiatric: She has a normal mood and affect. Her behavior is normal.    BP 122/78 mmHg  Pulse 65  Temp(Src) 98.1 F (36.7 C) (Oral)  Resp 18  Ht 5' 2.5" (1.588 m)  Wt 122 lb 8 oz (55.566 kg)  BMI 22.03 kg/m2  SpO2 95%  LMP 04/17/2005 Wt Readings from Last 3 Encounters:  02/22/15 122 lb 8 oz (55.566 kg)  06/05/14 125 lb (56.7 kg)  11/06/13 122 lb 12 oz (55.679 kg)     Lab Results  Component Value Date   WBC 9.5 09/25/2013   HGB 12.4 09/25/2013   HCT 36.6 09/25/2013   PLT 249.0 09/25/2013   GLUCOSE 90 09/19/2013   CHOL 214* 09/19/2013   TRIG 82.0 09/19/2013   HDL 66.70 09/19/2013   LDLDIRECT 119.2 10/31/2011   LDLCALC 131* 09/19/2013   ALT 19 09/19/2013   AST 25 09/19/2013   NA 143 09/19/2013   K 4.3 09/25/2013   CL 106 09/19/2013   CREATININE 0.8 09/19/2013   BUN 17 09/19/2013   CO2 29 09/19/2013   TSH 1.75 10/31/2011    Mm Digital Screening Bilateral  09/09/2014  CLINICAL DATA:  Screening. EXAM: DIGITAL SCREENING BILATERAL MAMMOGRAM WITH CAD COMPARISON:  Previous exam(s). ACR Breast Density Category c: The breast tissue is heterogeneously dense, which may obscure small masses. FINDINGS: There are no findings suspicious for malignancy. Images were processed with CAD. IMPRESSION: No mammographic evidence of malignancy. A result letter of this  screening mammogram will be mailed directly to the patient. RECOMMENDATION: Screening mammogram in one year. (Code:SM-B-01Y) BI-RADS CATEGORY  1: Negative. Electronically Signed   By: Dalphine HandingErin  Shaw M.D.   On: 09/09/2014 07:24       Assessment & Plan:   Problem List Items Addressed This Visit    Healthcare maintenance    Physical today 02/22/15.  mammogram 09/09/14 - birads I.  PAP 09/25/13 - negative with negative HPV.  Colonoscopy 02/28/08 - normal.  Recommended f/u in 10 years.  IFOB.       Hyperlipidemia, mild    Low cholesterol diet and exercise.  Follow lipid panel.        Relevant Orders   Comprehensive metabolic panel   Lipid panel   Osteopenia - Primary    Last bone density 2013 -  osteopenia.  Discussed weight bearing exercise.  Calcium and vitamin d.  Follow.        Relevant Orders   CBC with Differential/Platelet   TSH   Vit D  25 hydroxy (rtn osteoporosis monitoring)       Dale [place], MD

## 2015-02-22 NOTE — Progress Notes (Signed)
Pre-visit discussion using our clinic review tool. No additional management support is needed unless otherwise documented below in the visit note.  

## 2015-02-22 NOTE — Assessment & Plan Note (Signed)
Low cholesterol diet and exercise.  Follow lipid panel.   

## 2015-03-08 ENCOUNTER — Other Ambulatory Visit: Payer: 59

## 2015-03-12 ENCOUNTER — Other Ambulatory Visit (INDEPENDENT_AMBULATORY_CARE_PROVIDER_SITE_OTHER): Payer: 59

## 2015-03-12 DIAGNOSIS — Z139 Encounter for screening, unspecified: Secondary | ICD-10-CM

## 2015-03-15 ENCOUNTER — Other Ambulatory Visit: Payer: Self-pay | Admitting: *Deleted

## 2015-03-15 DIAGNOSIS — Z139 Encounter for screening, unspecified: Secondary | ICD-10-CM

## 2015-03-16 LAB — FECAL OCCULT BLOOD, IMMUNOCHEMICAL: FECAL OCCULT BLD: NEGATIVE

## 2015-03-17 ENCOUNTER — Encounter: Payer: Self-pay | Admitting: *Deleted

## 2015-04-19 ENCOUNTER — Other Ambulatory Visit: Payer: Self-pay | Admitting: Internal Medicine

## 2015-04-19 DIAGNOSIS — R319 Hematuria, unspecified: Secondary | ICD-10-CM

## 2015-04-19 NOTE — Progress Notes (Signed)
Order placed for f/u urinalysis 

## 2015-04-23 ENCOUNTER — Encounter: Payer: Self-pay | Admitting: *Deleted

## 2015-06-16 ENCOUNTER — Other Ambulatory Visit (INDEPENDENT_AMBULATORY_CARE_PROVIDER_SITE_OTHER): Payer: 59

## 2015-06-16 DIAGNOSIS — M8580 Other specified disorders of bone density and structure, unspecified site: Secondary | ICD-10-CM

## 2015-06-16 DIAGNOSIS — E785 Hyperlipidemia, unspecified: Secondary | ICD-10-CM | POA: Diagnosis not present

## 2015-06-16 DIAGNOSIS — R319 Hematuria, unspecified: Secondary | ICD-10-CM

## 2015-06-16 LAB — LIPID PANEL
Cholesterol: 237 mg/dL — ABNORMAL HIGH (ref 0–200)
HDL: 64.6 mg/dL (ref >39.00)
LDL CALC: 144 mg/dL — AB (ref 0–99)
NonHDL: 172.53
TRIGLYCERIDES: 143 mg/dL (ref 0.0–149.0)
Total CHOL/HDL Ratio: 4
VLDL: 28.6 mg/dL (ref 0.0–40.0)

## 2015-06-16 LAB — COMPREHENSIVE METABOLIC PANEL
ALT: 24 U/L (ref 0–35)
AST: 29 U/L (ref 0–37)
Albumin: 4.2 g/dL (ref 3.5–5.2)
Alkaline Phosphatase: 44 U/L (ref 39–117)
BUN: 18 mg/dL (ref 6–23)
CHLORIDE: 104 meq/L (ref 96–112)
CO2: 28 meq/L (ref 19–32)
CREATININE: 0.79 mg/dL (ref 0.40–1.20)
Calcium: 9.1 mg/dL (ref 8.4–10.5)
GFR: 78.77 mL/min (ref >60.00)
Glucose, Bld: 85 mg/dL (ref 70–99)
Potassium: 3.9 mEq/L (ref 3.5–5.1)
Sodium: 138 mEq/L (ref 135–145)
Total Bilirubin: 0.5 mg/dL (ref 0.2–1.2)
Total Protein: 6.6 g/dL (ref 6.0–8.3)

## 2015-06-16 LAB — CBC WITH DIFFERENTIAL/PLATELET
Basophils Absolute: 0 10*3/uL (ref 0.0–0.1)
Basophils Relative: 0.7 % (ref 0.0–3.0)
EOS ABS: 0.1 10*3/uL (ref 0.0–0.7)
Eosinophils Relative: 1.9 % (ref 0.0–5.0)
HEMATOCRIT: 38.1 % (ref 36.0–46.0)
Hemoglobin: 13 g/dL (ref 12.0–15.0)
LYMPHS ABS: 1.6 10*3/uL (ref 0.7–4.0)
LYMPHS PCT: 33.6 % (ref 12.0–46.0)
MCHC: 34.1 g/dL (ref 30.0–36.0)
MCV: 92.3 fl (ref 78.0–100.0)
Monocytes Absolute: 0.4 10*3/uL (ref 0.1–1.0)
Monocytes Relative: 9.3 % (ref 3.0–12.0)
NEUTROS ABS: 2.5 10*3/uL (ref 1.4–7.7)
NEUTROS PCT: 54.5 % (ref 43.0–77.0)
PLATELETS: 262 10*3/uL (ref 150.0–400.0)
RBC: 4.13 Mil/uL (ref 3.87–5.11)
RDW: 13.2 % (ref 11.5–15.5)
WBC: 4.6 10*3/uL (ref 4.0–10.5)

## 2015-06-16 LAB — TSH: TSH: 2.23 u[IU]/mL (ref 0.35–4.50)

## 2015-06-16 LAB — VITAMIN D 25 HYDROXY (VIT D DEFICIENCY, FRACTURES): VITD: 43.56 ng/mL (ref 30.00–100.00)

## 2016-02-22 ENCOUNTER — Encounter: Payer: 59 | Admitting: Internal Medicine

## 2016-03-23 ENCOUNTER — Other Ambulatory Visit: Payer: Self-pay | Admitting: Internal Medicine

## 2016-03-23 DIAGNOSIS — Z1231 Encounter for screening mammogram for malignant neoplasm of breast: Secondary | ICD-10-CM

## 2016-03-24 ENCOUNTER — Ambulatory Visit: Payer: 59 | Attending: Internal Medicine

## 2016-04-19 ENCOUNTER — Encounter: Payer: Self-pay | Admitting: Internal Medicine

## 2016-05-02 ENCOUNTER — Encounter: Payer: 59 | Admitting: Internal Medicine

## 2016-05-18 ENCOUNTER — Ambulatory Visit
Admission: RE | Admit: 2016-05-18 | Discharge: 2016-05-18 | Disposition: A | Discharge status: Home / Self Care | Payer: 59 | Source: Ambulatory Visit | Attending: Internal Medicine | Admitting: Internal Medicine

## 2016-05-18 DIAGNOSIS — Z1231 Encounter for screening mammogram for malignant neoplasm of breast: Secondary | ICD-10-CM | POA: Diagnosis not present

## 2016-09-25 ENCOUNTER — Encounter: Payer: Self-pay | Admitting: Internal Medicine

## 2016-09-25 ENCOUNTER — Ambulatory Visit (INDEPENDENT_AMBULATORY_CARE_PROVIDER_SITE_OTHER): Payer: 59 | Admitting: Internal Medicine

## 2016-09-25 ENCOUNTER — Other Ambulatory Visit (HOSPITAL_COMMUNITY)
Admission: RE | Admit: 2016-09-25 | Discharge: 2016-09-25 | Disposition: A | Discharge status: Home / Self Care | Payer: 59 | Source: Ambulatory Visit | Attending: Internal Medicine | Admitting: Internal Medicine

## 2016-09-25 VITALS — BP 120/76 | HR 91 | Temp 99.1°F | Resp 12 | Ht 61.81 in | Wt 125.2 lb

## 2016-09-25 DIAGNOSIS — E7800 Pure hypercholesterolemia, unspecified: Secondary | ICD-10-CM

## 2016-09-25 DIAGNOSIS — Z1211 Encounter for screening for malignant neoplasm of colon: Secondary | ICD-10-CM | POA: Diagnosis not present

## 2016-09-25 DIAGNOSIS — Z124 Encounter for screening for malignant neoplasm of cervix: Secondary | ICD-10-CM | POA: Insufficient documentation

## 2016-09-25 DIAGNOSIS — Z0000 Encounter for general adult medical examination without abnormal findings: Secondary | ICD-10-CM

## 2016-09-25 NOTE — Progress Notes (Signed)
Patient ID: Erika Wall, female   DOB: 1955/05/15, 61 y.o.   MRN: [id number]   Subjective:    Patient ID: Erika Wall, female    DOB: [birth year], 61 y.o.   MRN: [id number]  HPI  Patient here for her physical exam.  She reports she is doing well.  Feels good.  Stays active.  Exercises.  No chest pain.  No sob.  No acid reflux reported.  No abdominal pain or vaginal problems.  Bowels moving.  Overall feels good.  She has cut back her caffeine.     Past Medical History:  Diagnosis Date  . Arthritis    Past Surgical History:  Procedure Laterality Date  . BREAST BIOPSY Right 12/20/1995   Family History  Problem Relation Age of Onset  . Cancer Mother 52       uterine  . Stroke Mother   . Cancer Father        facial sinus cancer  . Arthritis Maternal Grandmother    Social History   Social History  . Marital status: Married    Spouse name: N/A  . Number of children: N/A  . Years of education: N/A   Occupational History  . Insurance [employer]   Social History Main Topics  . Smoking status: Never Smoker  . Smokeless tobacco: Never Used  . Alcohol use No  . Drug use: No  . Sexual activity: Not Asked   Other Topics Concern  . None   Social History Narrative  . None    Outpatient Encounter Prescriptions as of 09/25/2016  Medication Sig  . Biotin 1000 MCG tablet Take 1,000 mcg by mouth daily.  . fexofenadine (ALLEGRA) 180 MG tablet Take 180 mg by mouth as needed for allergies.   . fish oil-omega-3 fatty acids 1000 MG capsule Take 1 g by mouth daily.    . fluticasone (FLONASE) 50 MCG/ACT nasal spray Place 2 sprays into the nose daily.  . Multiple Vitamin (MULTIVITAMIN) capsule Take 1 capsule by mouth daily.     No facility-administered encounter medications on file as of 09/25/2016.     Review of Systems  Constitutional: Negative for appetite change and unexpected weight change.  HENT: Negative for congestion and sinus pressure.   Eyes: Negative  for pain and visual disturbance.  Respiratory: Negative for cough, chest tightness and shortness of breath.   Cardiovascular: Negative for chest pain, palpitations and leg swelling.  Gastrointestinal: Negative for abdominal pain, diarrhea, nausea and vomiting.  Genitourinary: Negative for difficulty urinating and dysuria.  Musculoskeletal: Negative for back pain and joint swelling.  Skin: Negative for color change and rash.  Neurological: Negative for dizziness, light-headedness and headaches.  Hematological: Negative for adenopathy. Does not bruise/bleed easily.  Psychiatric/Behavioral: Negative for agitation and dysphoric mood.       Objective:    Physical Exam  Constitutional: She is oriented to person, place, and time. She appears well-developed and well-nourished. No distress.  HENT:  Nose: Nose normal.  Mouth/Throat: Oropharynx is clear and moist.  Eyes: Right eye exhibits no discharge. Left eye exhibits no discharge. No scleral icterus.  Neck: Neck supple. No thyromegaly present.  Cardiovascular: Normal rate and regular rhythm.   Pulmonary/Chest: Breath sounds normal. No accessory muscle usage. No tachypnea. No respiratory distress. She has no decreased breath sounds. She has no wheezes. She has no rhonchi. Right breast exhibits no inverted nipple, no mass, no nipple discharge and no tenderness (no axillary adenopathy). Left breast exhibits no inverted  nipple, no mass, no nipple discharge and no tenderness (no axilarry adenopathy).  Abdominal: Soft. Bowel sounds are normal. There is no tenderness.  Genitourinary:  Genitourinary Comments: Normal external genitalia.  Vaginal vault without lesions.  Cervix identified.  Pap smear performed.  Could not appreciate any adnexal masses or tenderness.    Musculoskeletal: She exhibits no edema or tenderness.  Lymphadenopathy:    She has no cervical adenopathy.  Neurological: She is alert and oriented to person, place, and time.  Skin: Skin  is warm. No rash noted. No erythema.  Psychiatric: She has a normal mood and affect. Her behavior is normal.    BP 120/76 (BP Location: Left Arm, Patient Position: Sitting, Cuff Size: Normal)   Pulse 91   Temp 99.1 F (37.3 C) (Oral)   Resp 12   Ht 5' 1.81" (1.57 m)   Wt 125 lb 3.2 oz (56.8 kg)   LMP 04/17/2005   SpO2 98%   BMI 23.04 kg/m  Wt Readings from Last 3 Encounters:  09/25/16 125 lb 3.2 oz (56.8 kg)  02/22/15 122 lb 8 oz (55.6 kg)  06/05/14 125 lb (56.7 kg)     Lab Results  Component Value Date   WBC 4.6 06/16/2015   HGB 13.0 06/16/2015   HCT 38.1 06/16/2015   PLT 262.0 06/16/2015   GLUCOSE 85 06/16/2015   CHOL 237 (H) 06/16/2015   TRIG 143.0 06/16/2015   HDL 64.60 06/16/2015   LDLDIRECT 119.2 10/31/2011   LDLCALC 144 (H) 06/16/2015   ALT 24 06/16/2015   AST 29 06/16/2015   NA 138 06/16/2015   K 3.9 06/16/2015   CL 104 06/16/2015   CREATININE 0.79 06/16/2015   BUN 18 06/16/2015   CO2 28 06/16/2015   TSH 2.23 06/16/2015    Mm Screening Breast Tomo Bilateral  Result Date: 05/18/2016 CLINICAL DATA:  Screening. EXAM: 2D DIGITAL SCREENING BILATERAL MAMMOGRAM WITH CAD AND ADJUNCT TOMO COMPARISON:  Previous exam(s). ACR Breast Density Category c: The breast tissue is heterogeneously dense, which may obscure small masses. FINDINGS: There are no findings suspicious for malignancy. Images were processed with CAD. IMPRESSION: No mammographic evidence of malignancy. A result letter of this screening mammogram will be mailed directly to the patient. RECOMMENDATION: Screening mammogram in one year. (Code:SM-B-01Y) BI-RADS CATEGORY  1: Negative. Electronically Signed   By: Dalphine HandingErin  Shaw M.D.   On: 05/18/2016 18:05       Assessment & Plan:   Problem List Items Addressed This Visit    Healthcare maintenance    Physical today 09/25/16.  Mammogram 05/18/16 - Birads I.  PAP 09/25/16.  Colonoscopy 02/28/08 - normal.  Recommended f/u in 10 years.  IFOB.       Hypercholesterolemia     Exercises.  Watches her diet.  Check lipid panel.        Relevant Orders   CBC with Differential/Platelet   Comprehensive metabolic panel   TSH   Lipid panel    Other Visit Diagnoses    Colon cancer screening    -  Primary   Relevant Orders   Fecal occult blood, imunochemical       Dale DurhamSCOTT, Dameisha Tschida, MD

## 2016-09-25 NOTE — Assessment & Plan Note (Signed)
Exercises.  Watches her diet.  Check lipid panel.

## 2016-09-25 NOTE — Progress Notes (Signed)
Pre-visit discussion using our clinic review tool. No additional management support is needed unless otherwise documented below in the visit note.  

## 2016-09-25 NOTE — Assessment & Plan Note (Addendum)
Physical today 09/25/16.  Mammogram 05/18/16 - Birads I.  PAP 09/25/16.  Colonoscopy 02/28/08 - normal.  Recommended f/u in 10 years.  IFOB.

## 2016-09-25 NOTE — Addendum Note (Signed)
Addended by: Donnamarie PoagHOMPSON, Jonhatan Hearty Y on: 09/25/2016 09:50 AM   Modules accepted: Orders

## 2016-09-26 LAB — CYTOLOGY - PAP
Diagnosis: NEGATIVE
HPV (WINDOPATH): NOT DETECTED

## 2016-10-25 ENCOUNTER — Other Ambulatory Visit (INDEPENDENT_AMBULATORY_CARE_PROVIDER_SITE_OTHER): Payer: 59

## 2016-10-25 DIAGNOSIS — E7800 Pure hypercholesterolemia, unspecified: Secondary | ICD-10-CM | POA: Diagnosis not present

## 2016-10-25 LAB — COMPREHENSIVE METABOLIC PANEL
ALT: 13 U/L (ref 0–35)
AST: 20 U/L (ref 0–37)
Albumin: 4.4 g/dL (ref 3.5–5.2)
Alkaline Phosphatase: 49 U/L (ref 39–117)
BILIRUBIN TOTAL: 0.7 mg/dL (ref 0.2–1.2)
BUN: 16 mg/dL (ref 6–23)
CO2: 29 mEq/L (ref 19–32)
Calcium: 9.6 mg/dL (ref 8.4–10.5)
Chloride: 104 mEq/L (ref 96–112)
Creatinine, Ser: 0.94 mg/dL (ref 0.40–1.20)
GFR: 64.16 mL/min (ref >60.00)
GLUCOSE: 96 mg/dL (ref 70–99)
Potassium: 4.8 mEq/L (ref 3.5–5.1)
Sodium: 139 mEq/L (ref 135–145)
TOTAL PROTEIN: 6.8 g/dL (ref 6.0–8.3)

## 2016-10-25 LAB — CBC WITH DIFFERENTIAL/PLATELET
BASOS ABS: 0.1 10*3/uL (ref 0.0–0.1)
Basophils Relative: 1.1 % (ref 0.0–3.0)
Eosinophils Absolute: 0.2 10*3/uL (ref 0.0–0.7)
Eosinophils Relative: 2.9 % (ref 0.0–5.0)
HCT: 38.8 % (ref 36.0–46.0)
HEMOGLOBIN: 13.1 g/dL (ref 12.0–15.0)
LYMPHS PCT: 38.4 % (ref 12.0–46.0)
Lymphs Abs: 2.1 10*3/uL (ref 0.7–4.0)
MCHC: 33.7 g/dL (ref 30.0–36.0)
MCV: 93.8 fl (ref 78.0–100.0)
MONOS PCT: 8.5 % (ref 3.0–12.0)
Monocytes Absolute: 0.5 10*3/uL (ref 0.1–1.0)
NEUTROS PCT: 49.1 % (ref 43.0–77.0)
Neutro Abs: 2.6 10*3/uL (ref 1.4–7.7)
Platelets: 255 10*3/uL (ref 150.0–400.0)
RBC: 4.13 Mil/uL (ref 3.87–5.11)
RDW: 13.2 % (ref 11.5–15.5)
WBC: 5.4 10*3/uL (ref 4.0–10.5)

## 2016-10-25 LAB — LIPID PANEL
CHOL/HDL RATIO: 4
Cholesterol: 234 mg/dL — ABNORMAL HIGH (ref 0–200)
HDL: 63.7 mg/dL (ref >39.00)
LDL Cholesterol: 150 mg/dL — ABNORMAL HIGH (ref 0–99)
NonHDL: 169.83
Triglycerides: 99 mg/dL (ref 0.0–149.0)
VLDL: 19.8 mg/dL (ref 0.0–40.0)

## 2016-10-25 LAB — TSH: TSH: 2.68 u[IU]/mL (ref 0.35–4.50)

## 2016-11-01 ENCOUNTER — Other Ambulatory Visit (INDEPENDENT_AMBULATORY_CARE_PROVIDER_SITE_OTHER): Payer: 59

## 2016-11-01 ENCOUNTER — Encounter: Payer: Self-pay | Admitting: *Deleted

## 2016-11-01 DIAGNOSIS — Z1211 Encounter for screening for malignant neoplasm of colon: Secondary | ICD-10-CM | POA: Diagnosis not present

## 2016-11-01 LAB — FECAL OCCULT BLOOD, IMMUNOCHEMICAL: Fecal Occult Bld: NEGATIVE

## 2016-11-02 ENCOUNTER — Telehealth: Payer: Self-pay | Admitting: Internal Medicine

## 2016-11-02 NOTE — Telephone Encounter (Signed)
Spoke to patient see lab results.  

## 2016-11-02 NOTE — Telephone Encounter (Signed)
Pt called back returning your call.   Call pt @ [phone]. Thank you!

## 2017-09-17 ENCOUNTER — Other Ambulatory Visit: Payer: Self-pay | Admitting: Internal Medicine

## 2017-09-17 DIAGNOSIS — Z1231 Encounter for screening mammogram for malignant neoplasm of breast: Secondary | ICD-10-CM

## 2017-09-27 ENCOUNTER — Ambulatory Visit (INDEPENDENT_AMBULATORY_CARE_PROVIDER_SITE_OTHER): Payer: Managed Care, Other (non HMO) | Admitting: Internal Medicine

## 2017-09-27 ENCOUNTER — Other Ambulatory Visit (HOSPITAL_COMMUNITY)
Admission: RE | Admit: 2017-09-27 | Discharge: 2017-09-27 | Disposition: A | Discharge status: Home / Self Care | Payer: Managed Care, Other (non HMO) | Source: Ambulatory Visit | Attending: Internal Medicine | Admitting: Internal Medicine

## 2017-09-27 ENCOUNTER — Encounter: Payer: Self-pay | Admitting: Internal Medicine

## 2017-09-27 DIAGNOSIS — Z124 Encounter for screening for malignant neoplasm of cervix: Secondary | ICD-10-CM | POA: Insufficient documentation

## 2017-09-27 DIAGNOSIS — E7800 Pure hypercholesterolemia, unspecified: Secondary | ICD-10-CM

## 2017-09-27 DIAGNOSIS — Z1211 Encounter for screening for malignant neoplasm of colon: Secondary | ICD-10-CM | POA: Diagnosis not present

## 2017-09-27 DIAGNOSIS — Z0000 Encounter for general adult medical examination without abnormal findings: Secondary | ICD-10-CM

## 2017-09-27 NOTE — Assessment & Plan Note (Addendum)
Physical today 09/27/17.  Mammogram scheduled for 10/02/17.  PAP 09/25/16 - atrophy.  Negative HPV.  Satisfactory for evaluation.  Repeat pap today.  Colonoscopy 02/28/08 - normal.  Agreeable to colonoscopy.   Recommended f/u in 10 years.  Discussed shingrix.

## 2017-09-27 NOTE — Progress Notes (Signed)
Patient ID: Erika Wall, female   DOB: [birth year], 62 y.o.   MRN: [id number]   Subjective:    Patient ID: Erika Wall, female    DOB: Sep 28, 1955, 62 y.o.   MRN: [id number]  HPI  Patient here for her physical exam.  She reports she is doing well.  Stays active.  No chest pain.  No sob.  Watching her diet.  No acid reflux.  No abdominal pain.  Bowels moving.  No urine change.  Discussed the need for colonoscopy.  She is in agreement for referral.  Discussed shingrix.     Past Medical History:  Diagnosis Date  . Arthritis    Past Surgical History:  Procedure Laterality Date  . BREAST BIOPSY Right 12/20/1995   Family History  Problem Relation Age of Onset  . Cancer Mother 89       uterine  . Stroke Mother   . Cancer Father        facial sinus cancer  . Arthritis Maternal Grandmother    Social History   Socioeconomic History  . Marital status: Married    Spouse name: Not on file  . Number of children: Not on file  . Years of education: Not on file  . Highest education level: Not on file  Occupational History  . Occupation: Advertising account planner: [employer]  Social Needs  . Financial resource strain: Not on file  . Food insecurity:    Worry: Not on file    Inability: Not on file  . Transportation needs:    Medical: Not on file    Non-medical: Not on file  Tobacco Use  . Smoking status: Never Smoker  . Smokeless tobacco: Never Used  Substance and Sexual Activity  . Alcohol use: No    Alcohol/week: 0.0 oz  . Drug use: No  . Sexual activity: Not on file  Lifestyle  . Physical activity:    Days per week: Not on file    Minutes per session: Not on file  . Stress: Not on file  Relationships  . Social connections:    Talks on phone: Not on file    Gets together: Not on file    Attends religious service: Not on file    Active member of club or organization: Not on file    Attends meetings of clubs or organizations: Not on file    Relationship  status: Not on file  Other Topics Concern  . Not on file  Social History Narrative  . Not on file    Outpatient Encounter Medications as of 09/27/2017  Medication Sig  . triamcinolone (NASACORT) 55 MCG/ACT AERO nasal inhaler Place 2 sprays into the nose daily as needed.  . Biotin 1000 MCG tablet Take 1,000 mcg by mouth daily.  . fexofenadine (ALLEGRA) 180 MG tablet Take 180 mg by mouth as needed for allergies.   . fish oil-omega-3 fatty acids 1000 MG capsule Take 1 g by mouth daily.    . Multiple Vitamin (MULTIVITAMIN) capsule Take 1 capsule by mouth daily.    . [DISCONTINUED] fluticasone (FLONASE) 50 MCG/ACT nasal spray Place 2 sprays into the nose daily.   No facility-administered encounter medications on file as of 09/27/2017.     Review of Systems  Constitutional: Negative for appetite change and unexpected weight change.  HENT: Negative for congestion and sinus pressure.   Eyes: Negative for pain and visual disturbance.  Respiratory: Negative for cough, chest tightness and shortness of breath.  Cardiovascular: Negative for chest pain, palpitations and leg swelling.  Gastrointestinal: Negative for abdominal pain, diarrhea, nausea and vomiting.  Genitourinary: Negative for difficulty urinating and dysuria.  Musculoskeletal: Negative for joint swelling and myalgias.  Skin: Negative for color change and rash.  Neurological: Negative for dizziness, light-headedness and headaches.  Hematological: Negative for adenopathy. Does not bruise/bleed easily.  Psychiatric/Behavioral: Negative for decreased concentration and dysphoric mood.       Objective:    Physical Exam  Constitutional: She is oriented to person, place, and time. She appears well-developed and well-nourished. No distress.  HENT:  Nose: Nose normal.  Mouth/Throat: Oropharynx is clear and moist.  Eyes: Right eye exhibits no discharge. Left eye exhibits no discharge. No scleral icterus.  Neck: Neck supple. No  thyromegaly present.  Cardiovascular: Normal rate and regular rhythm.  Pulmonary/Chest: Breath sounds normal. No accessory muscle usage. No tachypnea. No respiratory distress. She has no decreased breath sounds. She has no wheezes. She has no rhonchi. Right breast exhibits no inverted nipple, no mass, no nipple discharge and no tenderness (no axillary adenopathy). Left breast exhibits no inverted nipple, no mass, no nipple discharge and no tenderness (no axilarry adenopathy).  Abdominal: Soft. Bowel sounds are normal. There is no tenderness.  Genitourinary:  Genitourinary Comments: Normal external genitalia.  Vaginal vault without lesions.  Cervix identified.  Pap smear performed.  Could not appreciate any adnexal masses or tenderness.    Musculoskeletal: She exhibits no edema or tenderness.  Lymphadenopathy:    She has no cervical adenopathy.  Neurological: She is alert and oriented to person, place, and time.  Skin: No rash noted. No erythema.  Psychiatric: She has a normal mood and affect. Her behavior is normal.    BP 136/74 (BP Location: Left Arm, Patient Position: Sitting, Cuff Size: Normal)   Pulse 61   Temp 97.9 F (36.6 C) (Oral)   Resp 18   Ht 5\' 2"  (1.575 m)   Wt 122 lb 6.4 oz (55.5 kg)   LMP 04/17/2005   SpO2 98%   BMI 22.39 kg/m  Wt Readings from Last 3 Encounters:  09/27/17 122 lb 6.4 oz (55.5 kg)  09/25/16 125 lb 3.2 oz (56.8 kg)  02/22/15 122 lb 8 oz (55.6 kg)     Lab Results  Component Value Date   WBC 5.4 10/25/2016   HGB 13.1 10/25/2016   HCT 38.8 10/25/2016   PLT 255.0 10/25/2016   GLUCOSE 96 10/25/2016   CHOL 234 (H) 10/25/2016   TRIG 99.0 10/25/2016   HDL 63.70 10/25/2016   LDLDIRECT 119.2 10/31/2011   LDLCALC 150 (H) 10/25/2016   ALT 13 10/25/2016   AST 20 10/25/2016   NA 139 10/25/2016   K 4.8 10/25/2016   CL 104 10/25/2016   CREATININE 0.94 10/25/2016   BUN 16 10/25/2016   CO2 29 10/25/2016   TSH 2.68 10/25/2016       Assessment & Plan:    Problem List Items Addressed This Visit    Healthcare maintenance    Physical today 09/27/17.  Mammogram scheduled for 10/02/17.  PAP 09/25/16 - atrophy.  Negative HPV.  Satisfactory for evaluation.  Repeat pap today.  Colonoscopy 02/28/08 - normal.  Agreeable to colonoscopy.   Recommended f/u in 10 years.  Discussed shingrix.        Hypercholesterolemia    Exercises. Watching her diet.  Follow lipid panel.        Relevant Orders   CBC with Differential/Platelet   Comprehensive metabolic panel  TSH   Lipid panel    Other Visit Diagnoses    Cervical cancer screening       Relevant Orders   Cytology - PAP (Completed)   Colon cancer screening       Relevant Orders   Ambulatory referral to Gastroenterology       Dale DurhamSCOTT, Kashonda Sarkisyan, MD

## 2017-09-28 LAB — CYTOLOGY - PAP
Diagnosis: NEGATIVE
HPV: NOT DETECTED

## 2017-09-30 ENCOUNTER — Encounter: Payer: Self-pay | Admitting: Internal Medicine

## 2017-09-30 NOTE — Assessment & Plan Note (Signed)
Exercises. Watching her diet.  Follow lipid panel.

## 2017-10-02 ENCOUNTER — Ambulatory Visit
Admission: RE | Admit: 2017-10-02 | Discharge: 2017-10-02 | Disposition: A | Discharge status: Home / Self Care | Payer: Managed Care, Other (non HMO) | Source: Ambulatory Visit | Attending: Internal Medicine | Admitting: Internal Medicine

## 2017-10-02 DIAGNOSIS — Z1231 Encounter for screening mammogram for malignant neoplasm of breast: Secondary | ICD-10-CM | POA: Diagnosis not present

## 2018-01-02 ENCOUNTER — Other Ambulatory Visit: Payer: Managed Care, Other (non HMO)

## 2018-01-29 ENCOUNTER — Other Ambulatory Visit (INDEPENDENT_AMBULATORY_CARE_PROVIDER_SITE_OTHER): Payer: Managed Care, Other (non HMO)

## 2018-01-29 DIAGNOSIS — E7800 Pure hypercholesterolemia, unspecified: Secondary | ICD-10-CM

## 2018-01-29 LAB — CBC WITH DIFFERENTIAL/PLATELET
BASOS PCT: 0.9 % (ref 0.0–3.0)
Basophils Absolute: 0 10*3/uL (ref 0.0–0.1)
EOS PCT: 1.6 % (ref 0.0–5.0)
Eosinophils Absolute: 0.1 10*3/uL (ref 0.0–0.7)
HEMATOCRIT: 40.5 % (ref 36.0–46.0)
HEMOGLOBIN: 13.3 g/dL (ref 12.0–15.0)
LYMPHS PCT: 35.9 % (ref 12.0–46.0)
Lymphs Abs: 2.1 10*3/uL (ref 0.7–4.0)
MCHC: 33 g/dL (ref 30.0–36.0)
MCV: 94 fl (ref 78.0–100.0)
MONOS PCT: 8.9 % (ref 3.0–12.0)
Monocytes Absolute: 0.5 10*3/uL (ref 0.1–1.0)
NEUTROS ABS: 3 10*3/uL (ref 1.4–7.7)
Neutrophils Relative %: 52.7 % (ref 43.0–77.0)
PLATELETS: 260 10*3/uL (ref 150.0–400.0)
RBC: 4.3 Mil/uL (ref 3.87–5.11)
RDW: 13.5 % (ref 11.5–15.5)
WBC: 5.7 10*3/uL (ref 4.0–10.5)

## 2018-01-29 LAB — LIPID PANEL
CHOLESTEROL: 254 mg/dL — AB (ref 0–200)
HDL: 74.9 mg/dL (ref >39.00)
LDL CALC: 159 mg/dL — AB (ref 0–99)
NonHDL: 179.01
Total CHOL/HDL Ratio: 3
Triglycerides: 101 mg/dL (ref 0.0–149.0)
VLDL: 20.2 mg/dL (ref 0.0–40.0)

## 2018-01-29 LAB — COMPREHENSIVE METABOLIC PANEL
ALT: 15 U/L (ref 0–35)
AST: 17 U/L (ref 0–37)
Albumin: 4.5 g/dL (ref 3.5–5.2)
Alkaline Phosphatase: 41 U/L (ref 39–117)
BUN: 17 mg/dL (ref 6–23)
CO2: 29 mEq/L (ref 19–32)
Calcium: 9.6 mg/dL (ref 8.4–10.5)
Chloride: 104 mEq/L (ref 96–112)
Creatinine, Ser: 0.84 mg/dL (ref 0.40–1.20)
GFR: 72.75 mL/min (ref >60.00)
Glucose, Bld: 93 mg/dL (ref 70–99)
POTASSIUM: 4.4 meq/L (ref 3.5–5.1)
Sodium: 139 mEq/L (ref 135–145)
TOTAL PROTEIN: 7 g/dL (ref 6.0–8.3)
Total Bilirubin: 0.6 mg/dL (ref 0.2–1.2)

## 2018-01-29 LAB — TSH: TSH: 1.97 u[IU]/mL (ref 0.35–4.50)

## 2018-03-22 LAB — HM COLONOSCOPY

## 2018-04-11 ENCOUNTER — Telehealth: Payer: Self-pay

## 2018-04-11 MED ORDER — OSELTAMIVIR PHOSPHATE 30 MG PO CAPS: 30.0000 mg | ORAL_CAPSULE | Freq: Every day | ORAL | 0 refills | Status: DC

## 2018-04-11 NOTE — Telephone Encounter (Signed)
Message for Dr. Birdie SonsSonnenberg reviewed with patient. States she will "Hold off" on medication. States she will CB if symptoms develop.

## 2018-04-11 NOTE — Addendum Note (Signed)
Addended by: Glori LuisSONNENBERG, Teyonna Plaisted G on: 04/11/2018 05:08 PM   Modules accepted: Orders

## 2018-04-11 NOTE — Telephone Encounter (Signed)
I am happy to send this in for her though she does not appear to have any high risk conditions that would necessitate prophylactic treatment with Tamiflu. The medication may help prevent her from getting the flu. If she would like to take the Tamiflu prophylactically she would need to understand that there are some psychiatric complications that can occur with the medication such as delirium, confusion, and hallucinations.  If she develops those while taking the medication she would need to be evaluated.  Medication may also be expensive.  If she develops symptoms she needs to contact us.  Thanks.

## 2018-04-11 NOTE — Telephone Encounter (Signed)
Copied from CRM [phone]. Topic: General - Other >> Apr 11, 2018 11:22 AM Percival SpanishKennedy, Cheryl W wrote:  Pt said she was around her grand daughter who has the flu and is asking if Tamiflu can be called in for her she has no symptons and did take the flu shot

## 2018-04-11 NOTE — Telephone Encounter (Signed)
LMTCB. PEC may speak with pt.  

## 2018-09-30 ENCOUNTER — Encounter: Payer: Managed Care, Other (non HMO) | Admitting: Internal Medicine

## 2018-10-09 ENCOUNTER — Encounter: Payer: Self-pay | Admitting: Internal Medicine

## 2018-10-09 ENCOUNTER — Other Ambulatory Visit: Payer: Self-pay | Admitting: Internal Medicine

## 2018-10-09 ENCOUNTER — Other Ambulatory Visit: Payer: Self-pay

## 2018-10-09 ENCOUNTER — Ambulatory Visit (INDEPENDENT_AMBULATORY_CARE_PROVIDER_SITE_OTHER): Payer: Managed Care, Other (non HMO) | Admitting: Internal Medicine

## 2018-10-09 VITALS — BP 114/62 | HR 66 | Temp 98.5°F | Resp 16 | Wt 113.4 lb

## 2018-10-09 DIAGNOSIS — Z1239 Encounter for other screening for malignant neoplasm of breast: Secondary | ICD-10-CM

## 2018-10-09 DIAGNOSIS — D72819 Decreased white blood cell count, unspecified: Secondary | ICD-10-CM

## 2018-10-09 DIAGNOSIS — Z13220 Encounter for screening for lipoid disorders: Secondary | ICD-10-CM

## 2018-10-09 DIAGNOSIS — E7800 Pure hypercholesterolemia, unspecified: Secondary | ICD-10-CM

## 2018-10-09 DIAGNOSIS — Z0000 Encounter for general adult medical examination without abnormal findings: Secondary | ICD-10-CM

## 2018-10-09 LAB — COMPREHENSIVE METABOLIC PANEL
ALT: 18 U/L (ref 0–35)
AST: 23 U/L (ref 0–37)
Albumin: 4.2 g/dL (ref 3.5–5.2)
Alkaline Phosphatase: 35 U/L — ABNORMAL LOW (ref 39–117)
BUN: 11 mg/dL (ref 6–23)
CO2: 28 mEq/L (ref 19–32)
Calcium: 8.9 mg/dL (ref 8.4–10.5)
Chloride: 102 mEq/L (ref 96–112)
Creatinine, Ser: 0.87 mg/dL (ref 0.40–1.20)
GFR: 65.59 mL/min (ref >60.00)
Glucose, Bld: 86 mg/dL (ref 70–99)
Potassium: 4.2 mEq/L (ref 3.5–5.1)
Sodium: 137 mEq/L (ref 135–145)
Total Bilirubin: 0.7 mg/dL (ref 0.2–1.2)
Total Protein: 6.2 g/dL (ref 6.0–8.3)

## 2018-10-09 LAB — CBC WITH DIFFERENTIAL/PLATELET
Basophils Absolute: 0 10*3/uL (ref 0.0–0.1)
Basophils Relative: 1.3 % (ref 0.0–3.0)
Eosinophils Absolute: 0.1 10*3/uL (ref 0.0–0.7)
Eosinophils Relative: 2.8 % (ref 0.0–5.0)
HCT: 39.7 % (ref 36.0–46.0)
Hemoglobin: 13.1 g/dL (ref 12.0–15.0)
Lymphocytes Relative: 40 % (ref 12.0–46.0)
Lymphs Abs: 1.5 10*3/uL (ref 0.7–4.0)
MCHC: 32.9 g/dL (ref 30.0–36.0)
MCV: 95.4 fl (ref 78.0–100.0)
Monocytes Absolute: 0.3 10*3/uL (ref 0.1–1.0)
Monocytes Relative: 8.4 % (ref 3.0–12.0)
Neutro Abs: 1.8 10*3/uL (ref 1.4–7.7)
Neutrophils Relative %: 47.5 % (ref 43.0–77.0)
Platelets: 256 10*3/uL (ref 150.0–400.0)
RBC: 4.16 Mil/uL (ref 3.87–5.11)
RDW: 13.2 % (ref 11.5–15.5)
WBC: 3.8 10*3/uL — ABNORMAL LOW (ref 4.0–10.5)

## 2018-10-09 LAB — LIPID PANEL
Cholesterol: 203 mg/dL — ABNORMAL HIGH (ref 0–200)
HDL: 71.5 mg/dL (ref >39.00)
LDL Cholesterol: 115 mg/dL — ABNORMAL HIGH (ref 0–99)
NonHDL: 131.25
Total CHOL/HDL Ratio: 3
Triglycerides: 80 mg/dL (ref 0.0–149.0)
VLDL: 16 mg/dL (ref 0.0–40.0)

## 2018-10-09 LAB — TSH: TSH: 2.07 u[IU]/mL (ref 0.35–4.50)

## 2018-10-09 NOTE — Progress Notes (Signed)
Patient ID: Erika Wall, female   DOB: 1955-12-28, 63 y.o.   MRN: [id number]   Subjective:    Patient ID: Erika Wall, female    DOB: 10/24/1955, 63 y.o.   MRN: [id number]  HPI  Patient here for her physical exam.  She is doing well.  Feels good.  Stays active.  Watching her diet.  Exercising.  No chest pain.  No sob. No acid reflux.  No abdominal pain.  Bowels moving.  Working from home.  Trying to stay in due to covid restrictions.  No fever.  No cough, chest congestion or sob.  Handling stress.  Overall feeling good.     Past Medical History:  Diagnosis Date  . Arthritis    Past Surgical History:  Procedure Laterality Date  . BREAST BIOPSY Right 12/20/1995   Family History  Problem Relation Age of Onset  . Cancer Mother 17       uterine  . Stroke Mother   . Cancer Father        facial sinus cancer  . Arthritis Maternal Grandmother    Social History   Socioeconomic History  . Marital status: Married    Spouse name: Not on file  . Number of children: Not on file  . Years of education: Not on file  . Highest education level: Not on file  Occupational History  . Occupation: Chief Executive Officer: [employer]  . Financial resource strain: Not on file  . Food insecurity    Worry: Not on file    Inability: Not on file  . Transportation needs    Medical: Not on file    Non-medical: Not on file  Tobacco Use  . Smoking status: Never Smoker  . Smokeless tobacco: Never Used  Substance and Sexual Activity  . Alcohol use: No    Alcohol/week: 0.0 standard drinks  . Drug use: No  . Sexual activity: Not on file  Lifestyle  . Physical activity    Days per week: Not on file    Minutes per session: Not on file  . Stress: Not on file  Relationships  . Social Herbalist on phone: Not on file    Gets together: Not on file    Attends religious service: Not on file    Active member of club or organization: Not on file    Attends  meetings of clubs or organizations: Not on file    Relationship status: Not on file  Other Topics Concern  . Not on file  Social History Narrative  . Not on file    Outpatient Encounter Medications as of 10/09/2018  Medication Sig  . Biotin 1000 MCG tablet Take 1,000 mcg by mouth daily.  . fexofenadine (ALLEGRA) 180 MG tablet Take 180 mg by mouth as needed for allergies.   . fish oil-omega-3 fatty acids 1000 MG capsule Take 1 g by mouth daily.    . Multiple Vitamin (MULTIVITAMIN) capsule Take 1 capsule by mouth daily.    Marland Kitchen triamcinolone (NASACORT) 55 MCG/ACT AERO nasal inhaler Place 2 sprays into the nose daily as needed.  . [DISCONTINUED] oseltamivir (TAMIFLU) 30 MG capsule Take 1 capsule (30 mg total) by mouth daily.   No facility-administered encounter medications on file as of 10/09/2018.     Review of Systems  Constitutional: Negative for appetite change and unexpected weight change.  HENT: Negative for congestion and sinus pressure.   Eyes: Negative for pain  and visual disturbance.  Respiratory: Negative for cough, chest tightness and shortness of breath.   Cardiovascular: Negative for chest pain, palpitations and leg swelling.  Gastrointestinal: Negative for abdominal pain, diarrhea, nausea and vomiting.  Genitourinary: Negative for difficulty urinating and dysuria.  Musculoskeletal: Negative for joint swelling and myalgias.  Skin: Negative for color change and rash.  Neurological: Negative for dizziness, light-headedness and headaches.  Hematological: Negative for adenopathy. Does not bruise/bleed easily.  Psychiatric/Behavioral: Negative for agitation and dysphoric mood.       Objective:    Physical Exam Constitutional:      General: She is not in acute distress.    Appearance: Normal appearance. She is well-developed.  Eyes:     General: No scleral icterus.       Right eye: No discharge.        Left eye: No discharge.     Conjunctiva/sclera: Conjunctivae normal.   Neck:     Musculoskeletal: Neck supple. No muscular tenderness.     Thyroid: No thyromegaly.  Cardiovascular:     Rate and Rhythm: Normal rate and regular rhythm.  Pulmonary:     Effort: No tachypnea, accessory muscle usage or respiratory distress.     Breath sounds: Normal breath sounds. No decreased breath sounds or wheezing.  Chest:     Breasts:        Right: No inverted nipple, mass, nipple discharge or tenderness (no axillary adenopathy).        Left: No inverted nipple, mass, nipple discharge or tenderness (no axilarry adenopathy).  Abdominal:     General: Bowel sounds are normal.     Palpations: Abdomen is soft.     Tenderness: There is no abdominal tenderness.  Musculoskeletal:        General: No swelling or tenderness.  Lymphadenopathy:     Cervical: No cervical adenopathy.  Skin:    Findings: No erythema or rash.  Neurological:     Mental Status: She is alert and oriented to person, place, and time.  Psychiatric:        Mood and Affect: Mood normal.        Behavior: Behavior normal.     BP 114/62   Pulse 66   Temp 98.5 F (36.9 C) (Oral)   Resp 16   Wt 113 lb 6.4 oz (51.4 kg)   LMP 04/17/2005   SpO2 97%   BMI 20.74 kg/m  Wt Readings from Last 3 Encounters:  10/09/18 113 lb 6.4 oz (51.4 kg)  09/27/17 122 lb 6.4 oz (55.5 kg)  09/25/16 125 lb 3.2 oz (56.8 kg)     Lab Results  Component Value Date   WBC 3.8 (L) 10/09/2018   HGB 13.1 10/09/2018   HCT 39.7 10/09/2018   PLT 256.0 10/09/2018   GLUCOSE 86 10/09/2018   CHOL 203 (H) 10/09/2018   TRIG 80.0 10/09/2018   HDL 71.50 10/09/2018   LDLDIRECT 119.2 10/31/2011   LDLCALC 115 (H) 10/09/2018   ALT 18 10/09/2018   AST 23 10/09/2018   NA 137 10/09/2018   K 4.2 10/09/2018   CL 102 10/09/2018   CREATININE 0.87 10/09/2018   BUN 11 10/09/2018   CO2 28 10/09/2018   TSH 2.07 10/09/2018    Mm 3d Screen Breast Bilateral  Result Date: 10/03/2017 CLINICAL DATA:  Screening. EXAM: DIGITAL SCREENING  BILATERAL MAMMOGRAM WITH TOMO AND CAD COMPARISON:  Previous exam(s). ACR Breast Density Category c: The breast tissue is heterogeneously dense, which may obscure small masses. FINDINGS:  There are no findings suspicious for malignancy. Images were processed with CAD. IMPRESSION: No mammographic evidence of malignancy. A result letter of this screening mammogram will be mailed directly to the patient. RECOMMENDATION: Screening mammogram in one year. (Code:SM-B-01Y) BI-RADS CATEGORY  1: Negative. Electronically Signed   By: Ted Mcalpineobrinka  Dimitrova M.D.   On: 10/03/2017 13:01       Assessment & Plan:   Problem List Items Addressed This Visit    Healthcare maintenance    Physical today 10/09/18.  Mammogram 10/03/17 - Birads I.  Schedule f/u mammogram.  PAP 09/27/17 - negative with negative HPV.  Had colonoscopy last year.  Obtain results.        Hypercholesterolemia    She is exercising. Watching her diet.  Follow lipid panel.         Other Visit Diagnoses    Breast cancer screening    -  Primary   Relevant Orders   MM 3D SCREEN BREAST BILATERAL   Screening cholesterol level       Relevant Orders   CBC with Differential/Platelet (Completed)   Comprehensive metabolic panel (Completed)   TSH (Completed)   Lipid panel (Completed)       Dale Durhamharlene Serenidy Waltz, MD

## 2018-10-09 NOTE — Assessment & Plan Note (Addendum)
Physical today 10/09/18.  Mammogram 10/03/17 - Birads I.  Schedule f/u mammogram.  PAP 09/27/17 - negative with negative HPV.  Had colonoscopy last year.  Obtain results.

## 2018-10-09 NOTE — Progress Notes (Signed)
Order placed for f/u cbc.   

## 2018-10-09 NOTE — Assessment & Plan Note (Signed)
She is exercising. Watching her diet.  Follow lipid panel.

## 2018-10-09 NOTE — Patient Instructions (Signed)
- 

## 2018-10-11 ENCOUNTER — Telehealth: Payer: Self-pay | Admitting: Internal Medicine

## 2018-10-11 NOTE — Telephone Encounter (Signed)
Patient is calling back for lab results. Thank you.

## 2018-10-15 NOTE — Telephone Encounter (Signed)
See result note.  

## 2018-11-15 ENCOUNTER — Other Ambulatory Visit: Payer: Self-pay

## 2018-11-15 ENCOUNTER — Other Ambulatory Visit (INDEPENDENT_AMBULATORY_CARE_PROVIDER_SITE_OTHER): Payer: Managed Care, Other (non HMO)

## 2018-11-15 DIAGNOSIS — D72819 Decreased white blood cell count, unspecified: Secondary | ICD-10-CM

## 2018-11-15 LAB — CBC WITH DIFFERENTIAL/PLATELET
Basophils Absolute: 0.1 10*3/uL (ref 0.0–0.1)
Basophils Relative: 1.5 % (ref 0.0–3.0)
Eosinophils Absolute: 0.1 10*3/uL (ref 0.0–0.7)
Eosinophils Relative: 2 % (ref 0.0–5.0)
HCT: 36.1 % (ref 36.0–46.0)
Hemoglobin: 12 g/dL (ref 12.0–15.0)
Lymphocytes Relative: 38.6 % (ref 12.0–46.0)
Lymphs Abs: 1.9 10*3/uL (ref 0.7–4.0)
MCHC: 33.3 g/dL (ref 30.0–36.0)
MCV: 95.2 fl (ref 78.0–100.0)
Monocytes Absolute: 0.4 10*3/uL (ref 0.1–1.0)
Monocytes Relative: 8.7 % (ref 3.0–12.0)
Neutro Abs: 2.5 10*3/uL (ref 1.4–7.7)
Neutrophils Relative %: 49.2 % (ref 43.0–77.0)
Platelets: 265 10*3/uL (ref 150.0–400.0)
RBC: 3.79 Mil/uL — ABNORMAL LOW (ref 3.87–5.11)
RDW: 14 % (ref 11.5–15.5)
WBC: 5 10*3/uL (ref 4.0–10.5)

## 2018-11-16 ENCOUNTER — Encounter: Payer: Self-pay | Admitting: Internal Medicine

## 2018-11-19 ENCOUNTER — Other Ambulatory Visit: Payer: Self-pay

## 2018-11-19 ENCOUNTER — Ambulatory Visit
Admission: RE | Admit: 2018-11-19 | Discharge: 2018-11-19 | Disposition: A | Discharge status: Home / Self Care | Payer: Managed Care, Other (non HMO) | Source: Ambulatory Visit | Attending: Internal Medicine | Admitting: Internal Medicine

## 2018-11-19 DIAGNOSIS — Z1231 Encounter for screening mammogram for malignant neoplasm of breast: Secondary | ICD-10-CM | POA: Diagnosis not present

## 2018-11-19 DIAGNOSIS — Z1239 Encounter for other screening for malignant neoplasm of breast: Secondary | ICD-10-CM | POA: Diagnosis present

## 2019-06-18 ENCOUNTER — Ambulatory Visit (INDEPENDENT_AMBULATORY_CARE_PROVIDER_SITE_OTHER): Payer: Managed Care, Other (non HMO) | Admitting: Internal Medicine

## 2019-06-18 ENCOUNTER — Encounter: Payer: Self-pay | Admitting: Internal Medicine

## 2019-06-18 ENCOUNTER — Other Ambulatory Visit: Payer: Self-pay

## 2019-06-18 VITALS — BP 130/70 | HR 77 | Temp 97.6°F | Resp 16 | Wt 114.4 lb

## 2019-06-18 DIAGNOSIS — R300 Dysuria: Secondary | ICD-10-CM | POA: Diagnosis not present

## 2019-06-18 LAB — URINALYSIS, MICROSCOPIC ONLY

## 2019-06-18 LAB — POCT URINALYSIS DIPSTICK
Bilirubin, UA: NEGATIVE
Glucose, UA: NEGATIVE
Ketones, UA: NEGATIVE
Nitrite, UA: POSITIVE
Protein, UA: POSITIVE — AB
Spec Grav, UA: 1.01 (ref 1.010–1.025)
Urobilinogen, UA: 0.2 E.U./dL
pH, UA: 7 (ref 5.0–8.0)

## 2019-06-18 MED ORDER — NITROFURANTOIN MONOHYD MACRO 100 MG PO CAPS: 100.0000 mg | ORAL_CAPSULE | Freq: Two times a day (BID) | ORAL | 0 refills | Status: DC

## 2019-06-18 MED ORDER — PHENAZOPYRIDINE HCL 200 MG PO TABS: 200.0000 mg | ORAL_TABLET | Freq: Three times a day (TID) | ORAL | 0 refills | Status: DC | PRN

## 2019-06-18 NOTE — Progress Notes (Signed)
Patient ID: Erika Wall, female   DOB: 12/18/1955, 64 y.o.   MRN: [id number]   Subjective:    Patient ID: Erika Wall, female    DOB: 11/20/1955, 64 y.o.   MRN: [id number]  HPI This visit occurred during the SARS-CoV-2 public health emergency.  Safety protocols were in place, including screening questions prior to the visit, additional usage of staff PPE, and extensive cleaning of exam room while observing appropriate contact time as indicated for disinfecting solutions.  Patient here as a work in with concerns regarding uti.  Reports symptoms started Saturday.  Felt bad.  Noticed increased frequency.  Took cystex/AZO.  Pain some better today.  Some low back discomfort.  No vaginal symptoms.  No nausea or vomiting.  Discomfort with urination.  Feels similar to previous UTI.  Was diagnosed with UTI 05/19/19.  No urine culture obtained.     Past Medical History:  Diagnosis Date  . Arthritis    Past Surgical History:  Procedure Laterality Date  . BREAST BIOPSY Right 12/20/1995   neg   Family History  Problem Relation Age of Onset  . Cancer Mother 29       uterine  . Stroke Mother   . Cancer Father        facial sinus cancer  . Arthritis Maternal Grandmother   . Breast cancer Neg Hx    Social History   Socioeconomic History  . Marital status: Married    Spouse name: Not on file  . Number of children: Not on file  . Years of education: Not on file  . Highest education level: Not on file  Occupational History  . Occupation: Chief Executive Officer: [employer]  Tobacco Use  . Smoking status: Never Smoker  . Smokeless tobacco: Never Used  Substance and Sexual Activity  . Alcohol use: No    Alcohol/week: 0.0 standard drinks  . Drug use: No  . Sexual activity: Not on file  Other Topics Concern  . Not on file  Social History Narrative  . Not on file   Social Determinants of Health   Financial Resource Strain:   . Difficulty of Paying Living Expenses:  Not on file  Food Insecurity:   . Worried About Charity fundraiser in the Last Year: Not on file  . Ran Out of Food in the Last Year: Not on file  Transportation Needs:   . Lack of Transportation (Medical): Not on file  . Lack of Transportation (Non-Medical): Not on file  Physical Activity:   . Days of Exercise per Week: Not on file  . Minutes of Exercise per Session: Not on file  Stress:   . Feeling of Stress : Not on file  Social Connections:   . Frequency of Communication with Friends and Family: Not on file  . Frequency of Social Gatherings with Friends and Family: Not on file  . Attends Religious Services: Not on file  . Active Member of Clubs or Organizations: Not on file  . Attends Archivist Meetings: Not on file  . Marital Status: Not on file    Outpatient Encounter Medications as of 06/18/2019  Medication Sig  . Biotin 1000 MCG tablet Take 1,000 mcg by mouth daily.  . fexofenadine (ALLEGRA) 180 MG tablet Take 180 mg by mouth as needed for allergies.   . fish oil-omega-3 fatty acids 1000 MG capsule Take 1 g by mouth daily.    . Multiple Vitamin (MULTIVITAMIN) capsule Take  1 capsule by mouth daily.    . nitrofurantoin, macrocrystal-monohydrate, (MACROBID) 100 MG capsule Take 1 capsule (100 mg total) by mouth 2 (two) times daily.  . phenazopyridine (PYRIDIUM) 200 MG tablet Take 1 tablet (200 mg total) by mouth 3 (three) times daily as needed for pain.  Marland Kitchen triamcinolone (NASACORT) 55 MCG/ACT AERO nasal inhaler Place 2 sprays into the nose daily as needed.   No facility-administered encounter medications on file as of 06/18/2019.    Review of Systems  Constitutional: Negative for appetite change, fever and unexpected weight change.  Respiratory: Negative for cough.        No chest congestion.   Gastrointestinal: Negative for abdominal pain, nausea and vomiting.  Genitourinary: Positive for dysuria and frequency.  Musculoskeletal: Positive for back pain. Negative for  joint swelling and myalgias.  Skin: Negative for color change and rash.  Neurological: Negative for dizziness and headaches.  Psychiatric/Behavioral: Negative for agitation and dysphoric mood.       Objective:    Physical Exam Constitutional:      General: She is not in acute distress.    Appearance: Normal appearance.  Neck:     Thyroid: No thyromegaly.  Cardiovascular:     Rate and Rhythm: Normal rate and regular rhythm.  Pulmonary:     Effort: No respiratory distress.     Breath sounds: Normal breath sounds. No wheezing.  Abdominal:     General: Bowel sounds are normal.     Palpations: Abdomen is soft.     Tenderness: There is no abdominal tenderness.  Musculoskeletal:        General: No swelling or tenderness.     Comments: No tenderness to palpation - No CVA tenderness.   Skin:    Findings: No erythema or rash.  Neurological:     Mental Status: She is alert.  Psychiatric:        Mood and Affect: Mood normal.        Behavior: Behavior normal.     BP 130/70   Pulse 77   Temp 97.6 F (36.4 C)   Resp 16   Wt 114 lb 6.4 oz (51.9 kg)   LMP 04/17/2005   SpO2 98%   BMI 20.92 kg/m  Wt Readings from Last 3 Encounters:  06/18/19 114 lb 6.4 oz (51.9 kg)  10/09/18 113 lb 6.4 oz (51.4 kg)  09/27/17 122 lb 6.4 oz (55.5 kg)     Lab Results  Component Value Date   WBC 5.0 11/15/2018   HGB 12.0 11/15/2018   HCT 36.1 11/15/2018   PLT 265.0 11/15/2018   GLUCOSE 86 10/09/2018   CHOL 203 (H) 10/09/2018   TRIG 80.0 10/09/2018   HDL 71.50 10/09/2018   LDLDIRECT 119.2 10/31/2011   LDLCALC 115 (H) 10/09/2018   ALT 18 10/09/2018   AST 23 10/09/2018   NA 137 10/09/2018   K 4.2 10/09/2018   CL 102 10/09/2018   CREATININE 0.87 10/09/2018   BUN 11 10/09/2018   CO2 28 10/09/2018   TSH 2.07 10/09/2018    MM 3D SCREEN BREAST BILATERAL  Result Date: 11/19/2018 CLINICAL DATA:  Screening. EXAM: DIGITAL SCREENING BILATERAL MAMMOGRAM WITH TOMO AND CAD COMPARISON:  Previous  exam(s). ACR Breast Density Category b: There are scattered areas of fibroglandular density. FINDINGS: There are no findings suspicious for malignancy. Images were processed with CAD. IMPRESSION: No mammographic evidence of malignancy. A result letter of this screening mammogram will be mailed directly to the patient. RECOMMENDATION: Screening mammogram  in one year. (Code:SM-B-01Y) BI-RADS CATEGORY  1: Negative. Electronically Signed   By: Gerome Sam III M.D   On: 11/19/2018 10:43       Assessment & Plan:   Problem List Items Addressed This Visit    Dysuria - Primary    Urine dip with large amount of blood, positive nitrite and small leukocytes.  Symptoms and urine dip appear to be c/w UTI. Treat with macrobid.  Stay hydrated.  Await culture results.        Relevant Orders   POCT urinalysis dipstick (Completed)   Urine Culture (Completed)   Urine Microscopic (Completed)       Dale Peru, MD

## 2019-06-20 LAB — URINE CULTURE: Organism ID, Bacteria: NO GROWTH

## 2019-06-22 ENCOUNTER — Encounter: Payer: Self-pay | Admitting: Internal Medicine

## 2019-06-22 NOTE — Assessment & Plan Note (Signed)
Urine dip with large amount of blood, positive nitrite and small leukocytes.  Symptoms and urine dip appear to be c/w UTI. Treat with macrobid.  Stay hydrated.  Await culture results.

## 2019-06-23 ENCOUNTER — Telehealth: Payer: Self-pay | Admitting: Internal Medicine

## 2019-06-23 NOTE — Telephone Encounter (Signed)
Please call pt back about lab results from 06/18/19

## 2019-06-23 NOTE — Telephone Encounter (Signed)
See note

## 2019-06-24 ENCOUNTER — Other Ambulatory Visit: Payer: Self-pay | Admitting: Internal Medicine

## 2019-06-24 DIAGNOSIS — R300 Dysuria: Secondary | ICD-10-CM

## 2019-06-24 DIAGNOSIS — R319 Hematuria, unspecified: Secondary | ICD-10-CM

## 2019-06-24 NOTE — Progress Notes (Signed)
Order placed for referral to urology.  

## 2019-07-28 NOTE — Progress Notes (Signed)
07/29/19 8:21 AM   [name] 12-12-1955 [id number]  Referring provider: Einar Pheasant, [address] [place],  [place] [address]  Chief Complaint  Patient presents with  . Dysuria    HPI: Erika Wall is a 64 y.o. F who presents today for the evaluation and management of dysuria and hematuria.   She visited PCP on 06/18/19 c/o symptoms similar to UTI diagnosed on 05/19/19. Symptoms included increased frequency, low back discomfort, and discomfort with urination. Her urine on dip indicated large amounts of blood, positive nitrite and small leukocytes. She was treated with Macrobid.   F/u UA had 3-6 RBC/hpf, >50 WBC/hpf, and few bacteria with associated urine culture being negative.   She reports of numerous UTI infections in the past with 2 in the last 6 months. She notes of burning irritation and pressure w/ no itching in her vaginal area. Ibuprofen subsided symptoms. She is currently asymptomatic.    She reports having her UTIs treated at [facility] walk-in clinics as well as online clinics, no culture and sensitivity data available.  Occasionally treated without urine sample   She feels like her symptoms temporarily related to sexual intercourse. No pain with intercourse. Significant dryness.   Post menopausal 10 years. She denies prior use of topical estrogen cream. She currently uses cranberry tablets.   Denies hx of kidney stones, gross hematuria, tobacco use or working with Sports administrator.  No hx of cancer.  No issues with constipation.  PMH: Past Medical History:  Diagnosis Date  . Arthritis     Surgical History: Past Surgical History:  Procedure Laterality Date  . BREAST BIOPSY Right 12/20/1995   neg    Home Medications:  Allergies as of 07/29/2019   No Known Allergies     Medication List       Accurate as of July 29, 2019 11:59 PM. If you have any questions, ask your nurse or doctor.        STOP taking these medications    nitrofurantoin (macrocrystal-monohydrate) 100 MG capsule Commonly known as: Macrobid Stopped by: Hollice Espy, MD   phenazopyridine 200 MG tablet Commonly known as: Pyridium Stopped by: Hollice Espy, MD     TAKE these medications   Biotin 1000 MCG tablet Take 1,000 mcg by mouth daily.   conjugated estrogens vaginal cream Commonly known as: PREMARIN Place 4.48 Applicatorfuls vaginally daily. Use peas size amount per urethra and vagina three times a week, Monday Wednesday Friday Started by: Hollice Espy, MD   fexofenadine 180 MG tablet Commonly known as: ALLEGRA Take 180 mg by mouth as needed for allergies.   fish oil-omega-3 fatty acids 1000 MG capsule Take 1 g by mouth daily.   multivitamin capsule Take 1 capsule by mouth daily.   triamcinolone 55 MCG/ACT Aero nasal inhaler Commonly known as: NASACORT Place 2 sprays into the nose daily as needed.       Allergies: No Known Allergies  Family History: Family History  Problem Relation Age of Onset  . Cancer Mother 10       uterine  . Stroke Mother   . Cancer Father        facial sinus cancer  . Arthritis Maternal Grandmother   . Breast cancer Neg Hx     Social History:  reports that she has never smoked. She has never used smokeless tobacco. She reports that she does not drink alcohol or use drugs.   Physical Exam: BP 120/84   Pulse 86  Ht 5\' 2"  (1.575 m)   Wt 113 lb (51.3 kg)   LMP 04/17/2005   BMI 20.67 kg/m   Constitutional:  Alert and oriented, No acute distress. HEENT: Stella AT, moist mucus membranes.  Trachea midline, no masses. Cardiovascular: No clubbing, cyanosis, or edema. Respiratory: Normal respiratory effort, no increased work of breathing. Skin: No rashes, bruises or suspicious lesions. Neurologic: Grossly intact, no focal deficits, moving all 4 extremities. Psychiatric: Normal mood and affect.  Laboratory Data:  Urinalysis UA revealed 3-10 RBC/hpf.   Assessment & Plan:    1.  Microscopic hematuria  UA 3-10 RBC/hpf  We discussed the differential diagnosis for microscopic hematuria including nephrolithiasis, renal or upper tract tumors, bladder stones, UTIs, or bladder tumors as well as undetermined etiologies. Per AUA guidelines, I did recommend complete microscopic hematuria evaluation including CTU, possible urine cytology, and office cystoscopy.  2. rUTI  Unsure whether true UTI, treated at walk in facility and online  Strongly recommended presenting when symptomatic for same day visit  Counseled pt on prevention techniques including probiotics, cranberry tablets and estrogen cream (pea sized amount) 3x a week on urethra tube at night time Rx of Premarin given  3. Vaginal atrophy  Pt symptoms c/o atopic vaginitis in possible setting of rUTI Likely benefit form topical estrogen cream  Will examine at f/u visit to make definitive treatment  F/u in 4 weeks for cysto/pelvic   [facility] [address] [place], [place] [phone]  I, [phone], am acting as a scribe for Dr. Donne Hazel,  I have reviewed the above documentation for accuracy and completeness, and I agree with the above.   Vanna Scotland, MD

## 2019-07-29 ENCOUNTER — Ambulatory Visit (INDEPENDENT_AMBULATORY_CARE_PROVIDER_SITE_OTHER): Payer: Managed Care, Other (non HMO) | Admitting: Urology

## 2019-07-29 ENCOUNTER — Other Ambulatory Visit: Payer: Self-pay

## 2019-07-29 VITALS — BP 120/84 | HR 86 | Ht 62.0 in | Wt 113.0 lb

## 2019-07-29 DIAGNOSIS — R300 Dysuria: Secondary | ICD-10-CM | POA: Diagnosis not present

## 2019-07-29 DIAGNOSIS — N952 Postmenopausal atrophic vaginitis: Secondary | ICD-10-CM | POA: Diagnosis not present

## 2019-07-29 DIAGNOSIS — R3129 Other microscopic hematuria: Secondary | ICD-10-CM

## 2019-07-29 LAB — URINALYSIS, COMPLETE
Bilirubin, UA: NEGATIVE
Glucose, UA: NEGATIVE
Ketones, UA: NEGATIVE
Leukocytes,UA: NEGATIVE
Nitrite, UA: NEGATIVE
Protein,UA: NEGATIVE
Specific Gravity, UA: 1.01 (ref 1.005–1.030)
Urobilinogen, Ur: 0.2 mg/dL (ref 0.2–1.0)
pH, UA: 5.5 (ref 5.0–7.5)

## 2019-07-29 LAB — MICROSCOPIC EXAMINATION: Bacteria, UA: NONE SEEN

## 2019-07-29 MED ORDER — ESTROGENS, CONJUGATED 0.625 MG/GM VA CREA: 0.5000 g | TOPICAL_CREAM | Freq: Every day | VAGINAL | 1 refills | Status: DC

## 2019-07-29 NOTE — Patient Instructions (Signed)

## 2019-08-11 ENCOUNTER — Encounter: Payer: Self-pay | Admitting: Internal Medicine

## 2019-08-13 ENCOUNTER — Ambulatory Visit
Admission: RE | Admit: 2019-08-13 | Discharge: 2019-08-13 | Disposition: A | Discharge status: Home / Self Care | Payer: Managed Care, Other (non HMO) | Source: Ambulatory Visit | Attending: Urology | Admitting: Urology

## 2019-08-13 ENCOUNTER — Other Ambulatory Visit: Payer: Self-pay

## 2019-08-13 DIAGNOSIS — R300 Dysuria: Secondary | ICD-10-CM | POA: Diagnosis present

## 2019-08-13 LAB — POCT I-STAT CREATININE: Creatinine, Ser: 0.9 mg/dL (ref 0.44–1.00)

## 2019-08-13 MED ORDER — IOHEXOL 300 MG/ML  SOLN
100.0000 mL | Freq: Once | INTRAMUSCULAR | Status: AC | PRN
  Administered 2019-08-13: 08:00:00 100 mL via INTRAVENOUS

## 2019-08-25 NOTE — Progress Notes (Signed)
08/26/19  CC:  Chief Complaint  Patient presents with  . Cysto    HPI: Erika Wall is a 64 y.o. F w/ hx of dysuria and hematuria returns today for a cystoscopy.   She visited PCP on 06/18/19 c/o symptoms similar to UTI diagnosed on 05/19/19. Symptoms included increased frequency, low back discomfort, and discomfort with urination. Her urine on dip indicated large amounts of blood, positive nitrite and small leukocytes. She was treated with Macrobid.   F/u UA had 3-6 RBC/hpf, >50 WBC/hpf, and few bacteria with associated urine culture being negative.   On previous visit she reported of numerous UTI infections in the past with 2 in the last 6 months. She noted of burning irritation and pressure w/ no itching in her vaginal area. Ibuprofen subsided symptoms. She was asymptomatic at that time. Post menopausal 10 years. She denies prior use of topical estrogen cream. She currently uses cranberry tablets.   Denies hx of kidney stones, gross hematuria, tobacco use or working with Personnel officer.  No hx of cancer.  Recent CT urogram unremarkable as of 08/13/19.   Today, she reports of burning w/ use of topical estrogen cream so has discontinued use. She is also worried about malignant potential, no personal history of malignancy.  Please see previous note for details.   Last menstrual period 04/17/2005. Today's Vitals   08/26/19 0905  BP: (!) 147/82  Pulse: 82  NED. A&Ox3.   No respiratory distress   Abd soft, NT, ND Normal external genitalia with patent urethral meatus  Cystoscopy Procedure Note  Patient identification was confirmed, informed consent was obtained, and patient was prepped using Betadine solution.  Lidocaine jelly was administered per urethral meatus.    Procedure: - Flexible cystoscope introduced, without any difficulty.   - Thorough search of the bladder revealed:    normal urethral meatus    normal urothelium    Minimally hyperemic on posterior bladder  wall adjacent to trigone, non specific without discrete margins     no stones    no ulcers     no tumors    no urethral polyps    no trabeculation  - Ureteral orifices were normal in position and appearance.  Post-Procedure: - Patient tolerated the procedure well  Pelvic Exam: Marked vaginal and urethral atrophy, no significant prolapse.  Pertinent Imagings:  CLINICAL DATA:  Microhematuria  EXAM: CT ABDOMEN AND PELVIS WITHOUT AND WITH CONTRAST  TECHNIQUE: Multidetector CT imaging of the abdomen and pelvis was performed following the standard protocol before and following the bolus administration of intravenous contrast.  CONTRAST:  OMNIPAQUE IOHEXOL 300 MG/ML  SOLN  COMPARISON:  None.  FINDINGS: Lower chest: No acute abnormality.  Hepatobiliary: No solid liver abnormality is seen. No gallstones, gallbladder wall thickening, or biliary dilatation.  Pancreas: Unremarkable. No pancreatic ductal dilatation or surrounding inflammatory changes.  Spleen: Normal in size without significant abnormality.  Adrenals/Urinary Tract: Adrenal glands are unremarkable. Kidneys are normal, without renal calculi, solid lesion, or hydronephrosis. Bladder is unremarkable.  Stomach/Bowel: Stomach is within normal limits. Appendix appears normal. No evidence of bowel wall thickening, distention, or inflammatory changes. Sigmoid diverticulosis.  Vascular/Lymphatic: No significant vascular findings are present. No enlarged abdominal or pelvic lymph nodes.  Reproductive: Probable uterine fibroids.  Other: No abdominal wall hernia or abnormality. No abdominopelvic ascites.  Musculoskeletal: No acute or significant osseous findings.  IMPRESSION: 1. No evidence of urinary tract calculus, mass, or filling defect to explain hematuria. No hydronephrosis.  2.  Probable uterine fibroids.   Electronically Signed   By: Eddie Candle M.D.   On: 08/13/2019  11:45  I have personally reviewed the images and agree with radiologist interpretation.   Assessment/ Plan:  1. Microscopic blood UA today unremarkable for cystoscopy  CT Urogram unremarkable  Cysto revealed minimally hyperemic on posterior bladder wall adjacent to trigone, non specific Sent urine cystology, low suspcion for CIS   2. Vaginal atrophy  Pt discontinued use of topical estrogen cream due to burning Encouraged use of estrogen cream, reassured pt that it's safe to use given very low systemic absorption   I, Nethusan Sivanesan, am acting as a scribe for Dr. Hollice Espy,  I have reviewed the above documentation for accuracy and completeness, and I agree with the above.   Hollice Espy, MD

## 2019-08-26 ENCOUNTER — Encounter: Payer: Self-pay | Admitting: Urology

## 2019-08-26 ENCOUNTER — Ambulatory Visit (INDEPENDENT_AMBULATORY_CARE_PROVIDER_SITE_OTHER): Payer: Managed Care, Other (non HMO) | Admitting: Urology

## 2019-08-26 ENCOUNTER — Other Ambulatory Visit: Payer: Self-pay

## 2019-08-26 VITALS — BP 147/82 | HR 82

## 2019-08-26 DIAGNOSIS — R3129 Other microscopic hematuria: Secondary | ICD-10-CM

## 2019-08-27 LAB — URINALYSIS, COMPLETE
Bilirubin, UA: NEGATIVE
Glucose, UA: NEGATIVE
Ketones, UA: NEGATIVE
Leukocytes,UA: NEGATIVE
Nitrite, UA: NEGATIVE
Protein,UA: NEGATIVE
Specific Gravity, UA: 1.01 (ref 1.005–1.030)
Urobilinogen, Ur: 0.2 mg/dL (ref 0.2–1.0)
pH, UA: 6 (ref 5.0–7.5)

## 2019-08-27 LAB — MICROSCOPIC EXAMINATION: Bacteria, UA: NONE SEEN

## 2019-09-01 ENCOUNTER — Other Ambulatory Visit: Payer: Self-pay | Admitting: Urology

## 2019-09-01 ENCOUNTER — Telehealth: Payer: Self-pay

## 2019-09-01 NOTE — Telephone Encounter (Signed)
Incoming voicemail from patient stating she would like to know how much she paid at her office visit. Pt can be reached at [phone].

## 2019-10-14 ENCOUNTER — Ambulatory Visit (INDEPENDENT_AMBULATORY_CARE_PROVIDER_SITE_OTHER): Payer: Managed Care, Other (non HMO) | Admitting: Internal Medicine

## 2019-10-14 ENCOUNTER — Other Ambulatory Visit: Payer: Self-pay

## 2019-10-14 VITALS — BP 126/78 | HR 80 | Temp 97.8°F | Resp 16 | Ht 62.0 in | Wt 114.0 lb

## 2019-10-14 DIAGNOSIS — Z1231 Encounter for screening mammogram for malignant neoplasm of breast: Secondary | ICD-10-CM | POA: Diagnosis not present

## 2019-10-14 DIAGNOSIS — R319 Hematuria, unspecified: Secondary | ICD-10-CM

## 2019-10-14 DIAGNOSIS — G454 Transient global amnesia: Secondary | ICD-10-CM | POA: Diagnosis not present

## 2019-10-14 DIAGNOSIS — Z0000 Encounter for general adult medical examination without abnormal findings: Secondary | ICD-10-CM

## 2019-10-14 DIAGNOSIS — E7800 Pure hypercholesterolemia, unspecified: Secondary | ICD-10-CM

## 2019-10-14 LAB — CBC WITH DIFFERENTIAL/PLATELET
Basophils Absolute: 0.1 10*3/uL (ref 0.0–0.1)
Basophils Relative: 1.2 % (ref 0.0–3.0)
Eosinophils Absolute: 0.1 10*3/uL (ref 0.0–0.7)
Eosinophils Relative: 2.6 % (ref 0.0–5.0)
HCT: 38.3 % (ref 36.0–46.0)
Hemoglobin: 13 g/dL (ref 12.0–15.0)
Lymphocytes Relative: 37.4 % (ref 12.0–46.0)
Lymphs Abs: 1.7 10*3/uL (ref 0.7–4.0)
MCHC: 33.8 g/dL (ref 30.0–36.0)
MCV: 94.3 fl (ref 78.0–100.0)
Monocytes Absolute: 0.4 10*3/uL (ref 0.1–1.0)
Monocytes Relative: 9.7 % (ref 3.0–12.0)
Neutro Abs: 2.2 10*3/uL (ref 1.4–7.7)
Neutrophils Relative %: 49.1 % (ref 43.0–77.0)
Platelets: 247 10*3/uL (ref 150.0–400.0)
RBC: 4.06 Mil/uL (ref 3.87–5.11)
RDW: 13.7 % (ref 11.5–15.5)
WBC: 4.6 10*3/uL (ref 4.0–10.5)

## 2019-10-14 LAB — COMPREHENSIVE METABOLIC PANEL
ALT: 17 U/L (ref 0–35)
AST: 23 U/L (ref 0–37)
Albumin: 4.6 g/dL (ref 3.5–5.2)
Alkaline Phosphatase: 48 U/L (ref 39–117)
BUN: 21 mg/dL (ref 6–23)
CO2: 29 mEq/L (ref 19–32)
Calcium: 9.3 mg/dL (ref 8.4–10.5)
Chloride: 104 mEq/L (ref 96–112)
Creatinine, Ser: 0.9 mg/dL (ref 0.40–1.20)
GFR: 62.87 mL/min (ref >60.00)
Glucose, Bld: 92 mg/dL (ref 70–99)
Potassium: 4.3 mEq/L (ref 3.5–5.1)
Sodium: 139 mEq/L (ref 135–145)
Total Bilirubin: 0.6 mg/dL (ref 0.2–1.2)
Total Protein: 6.6 g/dL (ref 6.0–8.3)

## 2019-10-14 LAB — LIPID PANEL
Cholesterol: 221 mg/dL — ABNORMAL HIGH (ref 0–200)
HDL: 76.4 mg/dL (ref >39.00)
LDL Cholesterol: 129 mg/dL — ABNORMAL HIGH (ref 0–99)
NonHDL: 145.04
Total CHOL/HDL Ratio: 3
Triglycerides: 80 mg/dL (ref 0.0–149.0)
VLDL: 16 mg/dL (ref 0.0–40.0)

## 2019-10-14 LAB — TSH: TSH: 1.63 u[IU]/mL (ref 0.35–4.50)

## 2019-10-14 NOTE — Patient Instructions (Signed)
Examples of probiotics:  Align, culturelle or florastor 

## 2019-10-14 NOTE — Progress Notes (Signed)
Patient ID: Erika Wall, female   DOB: [birth year], 64 y.o.   MRN: [id number]   Subjective:    Patient ID: Erika Wall, female    DOB: Dec 13, 1955, 64 y.o.   MRN: [id number]  HPI This visit occurred during the SARS-CoV-2 public health emergency.  Safety protocols were in place, including screening questions prior to the visit, additional usage of staff PPE, and extensive cleaning of exam room while observing appropriate contact time as indicated for disinfecting solutions.  Patient here for her physical exam.  Was seen in ER 08/16/19 and diagnosed with transient global amnesia.  States she remembers waking up that morning.  Does not remember getting dressed or driving.  Reports was told she seemed confused.  Went to gym, but did not work out.  Would repeat things over and over.  Was taken to ER.  CTand MRI unrevealing.  Does not remember anything until in ER around 4:00.   Stays active.  No chest pain or sob.  No acid reflux or abdominal pain reported.  Discussed need for f/u with neurology.  They had discussed EEG.  Also seeing Dr Apolinar Junes for w/up - microscopic hematuria.  CT urogram unremarkable.  Cystoscopy revealed minimal hyperemia on posterior bladder wall adjacent to trigone.  Also with vaginal atrophy.  Using topical estrogen cream.  Has not had another UTI.  Overall she feels she is doing relatively well.    Past Medical History:  Diagnosis Date  . Arthritis    Past Surgical History:  Procedure Laterality Date  . BREAST BIOPSY Right 12/20/1995   neg   Family History  Problem Relation Age of Onset  . Cancer Mother 30       uterine  . Stroke Mother   . Cancer Father        facial sinus cancer  . Arthritis Maternal Grandmother   . Breast cancer Neg Hx    Social History   Socioeconomic History  . Marital status: Married    Spouse name: Not on file  . Number of children: Not on file  . Years of education: Not on file  . Highest education level: Not on file  Occupational History    . Occupation: Advertising account planner: [employer]  Tobacco Use  . Smoking status: Never Smoker  . Smokeless tobacco: Never Used  Substance and Sexual Activity  . Alcohol use: No    Alcohol/week: 0.0 standard drinks  . Drug use: No  . Sexual activity: Not on file  Other Topics Concern  . Not on file  Social History Narrative  . Not on file   Social Determinants of Health   Financial Resource Strain:   . Difficulty of Paying Living Expenses:   Food Insecurity:   . Worried About Programme researcher, broadcasting/film/video in the Last Year:   . Barista in the Last Year:   Transportation Needs:   . Freight forwarder (Medical):   Marland Kitchen Lack of Transportation (Non-Medical):   Physical Activity:   . Days of Exercise per Week:   . Minutes of Exercise per Session:   Stress:   . Feeling of Stress :   Social Connections:   . Frequency of Communication with Friends and Family:   . Frequency of Social Gatherings with Friends and Family:   . Attends Religious Services:   . Active Member of Clubs or Organizations:   . Attends Banker Meetings:   Marland Kitchen Marital Status:  Outpatient Encounter Medications as of 10/14/2019  Medication Sig  . Biotin 1000 MCG tablet Take 1,000 mcg by mouth daily.  Marland Kitchen conjugated estrogens (PREMARIN) vaginal cream Place 0.25 Applicatorfuls vaginally daily. Use peas size amount per urethra and vagina three times a week, Monday Wednesday Friday  . fexofenadine (ALLEGRA) 180 MG tablet Take 180 mg by mouth as needed for allergies.   . fish oil-omega-3 fatty acids 1000 MG capsule Take 1 g by mouth daily.    . Multiple Vitamin (MULTIVITAMIN) capsule Take 1 capsule by mouth daily.    Marland Kitchen triamcinolone (NASACORT) 55 MCG/ACT AERO nasal inhaler Place 2 sprays into the nose daily as needed.   No facility-administered encounter medications on file as of 10/14/2019.    Review of Systems  Constitutional: Negative for appetite change and unexpected weight  change.  HENT: Negative for congestion and sinus pressure.   Eyes: Negative for pain and visual disturbance.  Respiratory: Negative for cough, chest tightness and shortness of breath.   Cardiovascular: Negative for chest pain, palpitations and leg swelling.  Gastrointestinal: Negative for abdominal pain, diarrhea, nausea and vomiting.  Genitourinary: Negative for difficulty urinating and dysuria.  Musculoskeletal: Negative for joint swelling and myalgias.  Skin: Negative for color change and rash.  Neurological: Negative for dizziness, light-headedness and headaches.  Hematological: Negative for adenopathy. Does not bruise/bleed easily.  Psychiatric/Behavioral: Negative for agitation and dysphoric mood.       Objective:    Physical Exam Constitutional:      General: She is not in acute distress.    Appearance: Normal appearance. She is well-developed.  HENT:     Head: Normocephalic and atraumatic.     Right Ear: External ear normal.     Left Ear: External ear normal.  Eyes:     General: No scleral icterus.       Right eye: No discharge.        Left eye: No discharge.     Conjunctiva/sclera: Conjunctivae normal.  Neck:     Thyroid: No thyromegaly.  Cardiovascular:     Rate and Rhythm: Normal rate and regular rhythm.  Pulmonary:     Effort: No tachypnea, accessory muscle usage or respiratory distress.     Breath sounds: Normal breath sounds. No decreased breath sounds or wheezing.  Chest:     Breasts:        Right: No inverted nipple, mass, nipple discharge or tenderness (no axillary adenopathy).        Left: No inverted nipple, mass, nipple discharge or tenderness (no axilarry adenopathy).  Abdominal:     General: Bowel sounds are normal.     Palpations: Abdomen is soft.     Tenderness: There is no abdominal tenderness.  Musculoskeletal:        General: No swelling or tenderness.     Cervical back: Neck supple. No tenderness.  Lymphadenopathy:     Cervical: No cervical  adenopathy.  Skin:    Findings: No erythema or rash.  Neurological:     Mental Status: She is alert and oriented to person, place, and time.  Psychiatric:        Mood and Affect: Mood normal.        Behavior: Behavior normal.     BP 126/78   Pulse 80   Temp 97.8 F (36.6 C)   Resp 16   Ht 5\' 2"  (1.575 m)   Wt 114 lb (51.7 kg)   LMP 04/17/2005   SpO2 98%   BMI  20.85 kg/m  Wt Readings from Last 3 Encounters:  10/14/19 114 lb (51.7 kg)  07/29/19 113 lb (51.3 kg)  06/18/19 114 lb 6.4 oz (51.9 kg)     Lab Results  Component Value Date   WBC 4.6 10/14/2019   HGB 13.0 10/14/2019   HCT 38.3 10/14/2019   PLT 247.0 10/14/2019   GLUCOSE 92 10/14/2019   CHOL 221 (H) 10/14/2019   TRIG 80.0 10/14/2019   HDL 76.40 10/14/2019   LDLDIRECT 119.2 10/31/2011   LDLCALC 129 (H) 10/14/2019   ALT 17 10/14/2019   AST 23 10/14/2019   NA 139 10/14/2019   K 4.3 10/14/2019   CL 104 10/14/2019   CREATININE 0.90 10/14/2019   BUN 21 10/14/2019   CO2 29 10/14/2019   TSH 1.63 10/14/2019    CT HEMATURIA WORKUP  Result Date: 08/13/2019 CLINICAL DATA:  Microhematuria EXAM: CT ABDOMEN AND PELVIS WITHOUT AND WITH CONTRAST TECHNIQUE: Multidetector CT imaging of the abdomen and pelvis was performed following the standard protocol before and following the bolus administration of intravenous contrast. CONTRAST:  OMNIPAQUE IOHEXOL 300 MG/ML  SOLN COMPARISON:  None. FINDINGS: Lower chest: No acute abnormality. Hepatobiliary: No solid liver abnormality is seen. No gallstones, gallbladder wall thickening, or biliary dilatation. Pancreas: Unremarkable. No pancreatic ductal dilatation or surrounding inflammatory changes. Spleen: Normal in size without significant abnormality. Adrenals/Urinary Tract: Adrenal glands are unremarkable. Kidneys are normal, without renal calculi, solid lesion, or hydronephrosis. Bladder is unremarkable. Stomach/Bowel: Stomach is within normal limits. Appendix appears normal. No  evidence of bowel wall thickening, distention, or inflammatory changes. Sigmoid diverticulosis. Vascular/Lymphatic: No significant vascular findings are present. No enlarged abdominal or pelvic lymph nodes. Reproductive: Probable uterine fibroids. Other: No abdominal wall hernia or abnormality. No abdominopelvic ascites. Musculoskeletal: No acute or significant osseous findings. IMPRESSION: 1. No evidence of urinary tract calculus, mass, or filling defect to explain hematuria. No hydronephrosis. 2.  Probable uterine fibroids. Electronically Signed   By: Lauralyn Primes M.D.   On: 08/13/2019 11:45       Assessment & Plan:   Problem List Items Addressed This Visit    Healthcare maintenance    Physical today 10/14/19.  Mammogram 11/19/18 - Birads I.  PAP 09/27/17 - negative with negative HPV.  Had colonoscopy recently.  Obtain results.        Hematuria    Being worked up by urology as outlined.  Follow.        Hypercholesterolemia    Low cholesterol diet and exercise.  Follow lipid panel.        Relevant Orders   CBC with Differential/Platelet (Completed)   Comprehensive metabolic panel (Completed)   TSH (Completed)   Lipid panel (Completed)   TGA (transient global amnesia)    Recently seen in ER and diagnosed with TGA.  CT and MRI unrevealing.  Recommended neurology f/u and possible EEG, etc.  Pt agreeable to neurology referral.  Has not had reoccurring episode.  Follow.        Relevant Orders   Ambulatory referral to Neurology    Other Visit Diagnoses    Visit for screening mammogram       Relevant Orders   MM 3D SCREEN BREAST BILATERAL       Dale Worley, MD

## 2019-10-20 ENCOUNTER — Encounter: Payer: Self-pay | Admitting: Internal Medicine

## 2019-10-20 DIAGNOSIS — R319 Hematuria, unspecified: Secondary | ICD-10-CM | POA: Insufficient documentation

## 2019-10-20 NOTE — Assessment & Plan Note (Signed)
Recently seen in ER and diagnosed with TGA.  CT and MRI unrevealing.  Recommended neurology f/u and possible EEG, etc.  Pt agreeable to neurology referral.  Has not had reoccurring episode.  Follow.

## 2019-10-20 NOTE — Assessment & Plan Note (Signed)
Being worked up by urology as outlined.  Follow.

## 2019-10-20 NOTE — Assessment & Plan Note (Signed)
Low cholesterol diet and exercise.  Follow lipid panel.   

## 2019-10-20 NOTE — Assessment & Plan Note (Signed)
Physical today 10/14/19.  Mammogram 11/19/18 - Birads I.  PAP 09/27/17 - negative with negative HPV.  Had colonoscopy recently.  Obtain results.

## 2019-12-02 ENCOUNTER — Ambulatory Visit
Admission: RE | Admit: 2019-12-02 | Discharge: 2019-12-02 | Disposition: A | Discharge status: Home / Self Care | Payer: Managed Care, Other (non HMO) | Source: Ambulatory Visit | Attending: Internal Medicine | Admitting: Internal Medicine

## 2019-12-02 ENCOUNTER — Other Ambulatory Visit: Payer: Self-pay

## 2019-12-02 DIAGNOSIS — Z1231 Encounter for screening mammogram for malignant neoplasm of breast: Secondary | ICD-10-CM | POA: Insufficient documentation

## 2020-02-03 ENCOUNTER — Encounter: Payer: Self-pay | Admitting: Internal Medicine

## 2020-04-14 ENCOUNTER — Ambulatory Visit: Payer: Managed Care, Other (non HMO) | Admitting: Internal Medicine

## 2020-05-10 ENCOUNTER — Encounter: Payer: Self-pay | Admitting: Internal Medicine

## 2020-05-10 ENCOUNTER — Other Ambulatory Visit (INDEPENDENT_AMBULATORY_CARE_PROVIDER_SITE_OTHER): Payer: Managed Care, Other (non HMO)

## 2020-05-10 ENCOUNTER — Other Ambulatory Visit: Payer: Self-pay

## 2020-05-10 DIAGNOSIS — R300 Dysuria: Secondary | ICD-10-CM

## 2020-05-10 LAB — URINALYSIS, ROUTINE W REFLEX MICROSCOPIC
Bilirubin Urine: NEGATIVE
Ketones, ur: NEGATIVE
Nitrite: NEGATIVE
Specific Gravity, Urine: <=1.005 — AB (ref 1.000–1.030)
Total Protein, Urine: NEGATIVE
Urine Glucose: NEGATIVE
Urobilinogen, UA: 0.2 (ref 0.0–1.0)
pH: 6.5 (ref 5.0–8.0)

## 2020-05-10 NOTE — Telephone Encounter (Signed)
Pt called to follow up on MyChart  She is wanting to come in today to do a urine or be seen

## 2020-05-10 NOTE — Telephone Encounter (Signed)
Pt coming in today for urine and added to schedule on Wednesday for VV. Patient was agreeable to wait until Wednesday to see if culture would come back

## 2020-05-11 LAB — URINE CULTURE
MICRO NUMBER:: 11448661
SPECIMEN QUALITY:: ADEQUATE

## 2020-05-12 ENCOUNTER — Telehealth (INDEPENDENT_AMBULATORY_CARE_PROVIDER_SITE_OTHER): Payer: Managed Care, Other (non HMO) | Admitting: Internal Medicine

## 2020-05-12 ENCOUNTER — Encounter: Payer: Self-pay | Admitting: Internal Medicine

## 2020-05-12 DIAGNOSIS — R300 Dysuria: Secondary | ICD-10-CM | POA: Diagnosis not present

## 2020-05-12 MED ORDER — PHENAZOPYRIDINE HCL 200 MG PO TABS
200.0000 mg | ORAL_TABLET | Freq: Three times a day (TID) | ORAL | 0 refills | Status: DC | PRN
Start: 2020-05-12 — End: 2020-10-25

## 2020-05-12 NOTE — Progress Notes (Signed)
Patient ID: Erika Wall, female   DOB: April 17, 1956, 65 y.o.   MRN: [id number]   Virtual Visit via video Note  This visit type was conducted due to national recommendations for restrictions regarding the COVID-19 pandemic (e.g. social distancing).  This format is felt to be most appropriate for this patient at this time.  All issues noted in this document were discussed and addressed.  No physical exam was performed (except for noted visual exam findings with Video Visits).   I connected with Salvadore Farber by a video enabled telemedicine application and verified that I am speaking with the correct person using two identifiers. Location patient: home Location provider: work Persons participating in the virtual visit: patient, provider  The limitations, risks, security and privacy concerns of performing an evaluation and management service by video and the availability of in person appointments have been discussed. It has also been discussed with the patient that there may be a patient responsible charge related to this service. The patient expressed understanding and agreed to proceed.   Reason for visit: work in appt.   HPI: Work in with concerns regarding a possible urinary tract infection.  States symptoms started one week ago.  Noticed being uncomfortable when sitting.  Also pain when finishing urination.  When her bladder empties - notes discomfort.  No vaginal discharge or itching.  No abdominal pain.  Bowels moving.  States did notice symptoms started after intercourse.  No back pain. Eating and drinking.  No nausea or vomiting.  Previously saw urology.  S/p cystoscopy.  Diagnosed with vaginal atrophy. Instructed to start estrogen cream.  Worked well for her when using. Stopped using.  Concerned regarding family history of cancer.    ROS: See pertinent positives and negatives per HPI.  Past Medical History:  Diagnosis Date  . Arthritis     Past Surgical History:  Procedure Laterality  Date  . BREAST BIOPSY Right 12/20/1995   neg    Family History  Problem Relation Age of Onset  . Cancer Mother 7       uterine  . Stroke Mother   . Cancer Father        facial sinus cancer  . Arthritis Maternal Grandmother   . Breast cancer Neg Hx     SOCIAL HX: reviewed.    Current Outpatient Medications:  .  Biotin 1000 MCG tablet, Take 1,000 mcg by mouth daily., Disp: , Rfl:  .  Cholecalciferol (VITAMIN D-3) 125 MCG (5000 UT) TABS, Take by mouth., Disp: , Rfl:  .  COLLAGEN PO, Take 2,500 mg by mouth., Disp: , Rfl:  .  fexofenadine (ALLEGRA) 180 MG tablet, Take 180 mg by mouth as needed for allergies. , Disp: , Rfl:  .  fish oil-omega-3 fatty acids 1000 MG capsule, Take 1 g by mouth daily., Disp: , Rfl:  .  Multiple Vitamin (MULTIVITAMIN) capsule, Take 1 capsule by mouth daily., Disp: , Rfl:  .  phenazopyridine (PYRIDIUM) 200 MG tablet, Take 1 tablet (200 mg total) by mouth 3 (three) times daily as needed for pain., Disp: 6 tablet, Rfl: 0 .  triamcinolone (NASACORT) 55 MCG/ACT AERO nasal inhaler, Place 2 sprays into the nose daily as needed., Disp: , Rfl:  .  vitamin B-12 (CYANOCOBALAMIN) 1000 MCG tablet, Take 1,000 mcg by mouth daily., Disp: , Rfl:   EXAM:  GENERAL: alert, oriented, appears well and in no acute distress  HEENT: atraumatic, conjunttiva clear, no obvious abnormalities on inspection of external nose and ears  NECK: normal movements of the head and neck  LUNGS: on inspection no signs of respiratory distress, breathing rate appears normal, no obvious gross SOB, gasping or wheezing  CV: no obvious cyanosis  PSYCH/NEURO: pleasant and cooperative, no obvious depression or anxiety, speech and thought processing grossly intact  ASSESSMENT AND PLAN:  Discussed the following assessment and plan:  Problem List Items Addressed This Visit    Dysuria    Noticed discomfort at end urination.  Also noticed feeling uncomfortable when sitting.  Urine culture negative  for infection.  Given discomfort - start pyridium to help with possible bladder spasm.  Also discussed restarting estrogen cream. Discussed her concerns regarding the cream.  Follow. Notify if persistent symptoms.           I discussed the assessment and treatment plan with the patient. The patient was provided an opportunity to ask questions and all were answered. The patient agreed with the plan and demonstrated an understanding of the instructions.   The patient was advised to call back or seek an in-person evaluation if the symptoms worsen or if the condition fails to improve as anticipated.   Dale Otis, MD

## 2020-05-17 ENCOUNTER — Encounter: Payer: Self-pay | Admitting: Internal Medicine

## 2020-05-17 NOTE — Assessment & Plan Note (Signed)
Noticed discomfort at end urination.  Also noticed feeling uncomfortable when sitting.  Urine culture negative for infection.  Given discomfort - start pyridium to help with possible bladder spasm.  Also discussed restarting estrogen cream. Discussed her concerns regarding the cream.  Follow. Notify if persistent symptoms.

## 2020-05-26 ENCOUNTER — Encounter: Payer: Self-pay | Admitting: Internal Medicine

## 2020-05-26 DIAGNOSIS — G43109 Migraine with aura, not intractable, without status migrainosus: Secondary | ICD-10-CM | POA: Insufficient documentation

## 2020-10-05 ENCOUNTER — Ambulatory Visit
Admission: RE | Admit: 2020-10-05 | Discharge: 2020-10-05 | Disposition: A | Discharge status: Home / Self Care | Payer: Managed Care, Other (non HMO) | Attending: Family Medicine | Admitting: Family Medicine

## 2020-10-05 ENCOUNTER — Encounter: Payer: Self-pay | Admitting: Family Medicine

## 2020-10-05 ENCOUNTER — Other Ambulatory Visit: Payer: Self-pay

## 2020-10-05 ENCOUNTER — Ambulatory Visit
Admission: RE | Admit: 2020-10-05 | Discharge: 2020-10-05 | Disposition: A | Discharge status: Home / Self Care | Payer: Managed Care, Other (non HMO) | Source: Ambulatory Visit | Attending: Family Medicine | Admitting: Family Medicine

## 2020-10-05 ENCOUNTER — Ambulatory Visit: Payer: Managed Care, Other (non HMO) | Admitting: Family Medicine

## 2020-10-05 VITALS — BP 118/74 | HR 76 | Temp 98.0°F | Ht 62.0 in | Wt 119.0 lb

## 2020-10-05 DIAGNOSIS — M5412 Radiculopathy, cervical region: Secondary | ICD-10-CM

## 2020-10-05 MED ORDER — MELOXICAM 15 MG PO TABS: 15.0000 mg | ORAL_TABLET | Freq: Every day | ORAL | 0 refills | Status: DC

## 2020-10-05 MED ORDER — BACLOFEN 10 MG PO TABS: 10.0000 mg | ORAL_TABLET | Freq: Every day | ORAL | 0 refills | Status: DC

## 2020-10-05 MED ORDER — PREDNISONE 50 MG PO TABS: 50.0000 mg | ORAL_TABLET | Freq: Every day | ORAL | 0 refills | Status: DC

## 2020-10-05 NOTE — Assessment & Plan Note (Signed)
Right-hand-dominant patient with 1 week stated history of neck/shoulder pain left/central with paresthesias in the left upper extremity to the left forearm.  She denies any overt weakness.  At onset she states that she was performing overhead and pulled back upper body weighted exercises in the gym.  Denies any specific acute onset but has noted symptoms persist since that time throughout the entirety of the day with minor relief while she is laying supine in bed. Upon further questioning she does give history of longstanding neck issues with previous episode of right shoulder pain.  Physical examination findings reveals painful forward flexion of the cervical spine, symmetric upper extremity strength and sensation is intact, Spurling's only elicits local central neck pain, she has tenderness along the levator scapula and rhomboid minor regions on the left, shoulder examination reveals full painless range of motion, rotator cuff strength testing is benign, negative impingement testing, negative speeds, negative Yergason's.  Her overall clinical picture is most consistent with chronic cervical spine issue with recent exacerbation with adjunct cervical paraspinal muscle spasm.  I have ordered x-rays, advised relative rest from upper body activities, plan for patient initiated home-based rehab program, and medication management with scheduled meloxicam and low threshold to advance to prednisone; nightly baclofen as well.  She will return for follow-up in 2 weeks, pending clinical response and x-rays, can discuss further escalation of treatment to possibly include trigger point injections, referral to formal PT, advanced imaging, transition of pharmacotherapy.

## 2020-10-05 NOTE — Patient Instructions (Addendum)
-   Obtain neck x-ray with order provided - Start meloxicam, dose once daily with food (no other NSAIDs while on this medication) - Dose nightly muscle relaxer baclofen, side effect and be drowsiness - Hold from upper body activities in the gym as discussed - Start home exercises with information provided by this weekend, focus on slow and steady advance Start with packet labeled #1 Can progress to packet labeled #2 once initial exercises are easily tolerated - Can start prednisone this weekend/Monday of next week if symptoms fail to adequately progress - Contact us for any questions and follow-up in 2 weeks

## 2020-10-05 NOTE — Progress Notes (Signed)
Primary Care / Sports Medicine Office Visit  Patient Information:  Patient ID: Erika Wall, female DOB: 12-Feb-1956 Age: 65 y.o. MRN: [id number]   Erika Wall is a pleasant 65 y.o. female presenting with the following:  Chief Complaint  Patient presents with   Shoulder Pain    L) shoulder radiating down L) arm- started after weight class- naproxen. 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   Biotin 1000 MCG tablet Take 1,000 mcg by mouth daily.   Cholecalciferol (VITAMIN D-3) 125 MCG (5000 UT) TABS Take by mouth.   COLLAGEN PO Take 2,500 mg by mouth.   fexofenadine (ALLEGRA) 180 MG tablet Take 180 mg by mouth as needed for allergies.    fish oil-omega-3 fatty acids 1000 MG capsule Take 1 g by mouth daily.   meloxicam (MOBIC) 15 MG tablet Take 1 tablet (15 mg total) by mouth daily.   Multiple Vitamin (MULTIVITAMIN) capsule Take 1 capsule by mouth daily.   predniSONE (DELTASONE) 50 MG tablet Take 1 tablet (50 mg total) by mouth daily.   triamcinolone (NASACORT) 55 MCG/ACT AERO nasal inhaler Place 2 sprays into the nose daily as needed.   vitamin B-12 (CYANOCOBALAMIN) 1000 MCG tablet Take 1,000 mcg by mouth daily.    phenazopyridine (PYRIDIUM) 200 MG tablet Take 1 tablet (200 mg total) by mouth 3 (three) times daily as needed for pain. (Patient not taking: Reported on 10/05/2020)   No facility-administered encounter medications on file as of 10/05/2020.   Past Surgical History:  Procedure Laterality Date   BREAST BIOPSY Right 12/20/1995   neg    Vitals:   10/05/20 1343  BP: 118/74  Pulse: 76  Temp: 98 F (36.7 C)  SpO2: 99%   Vitals:   10/05/20 1343  Weight: 119 lb (54 kg)  Height: 5\' 2"  (1.575 m)   Body mass index is 21.77 kg/m.  No results found.   Independent interpretation of notes and tests performed by another provider:   None  Procedures performed:   None  Pertinent History, Exam, Impression, and Recommendations:   Radiculitis of left cervical region Right-hand-dominant patient with 1 week stated history of neck/shoulder pain left/central with paresthesias in the left upper extremity to the left forearm.  She denies any overt weakness.  At onset she states that she was performing overhead and pulled back upper body weighted exercises in the gym.  Denies any specific acute onset but has noted symptoms persist since that time throughout the entirety of the day with minor relief while she is laying supine in bed. Upon further questioning she does give history of longstanding neck issues with previous episode of right shoulder pain.  Physical examination findings reveals painful forward  flexion of the cervical spine, symmetric upper extremity strength and sensation is intact, Spurling's only elicits local central neck pain, she has tenderness along the levator scapula and rhomboid minor regions on the left, shoulder examination reveals full painless range of motion, rotator cuff strength testing is benign, negative impingement testing, negative speeds, negative Yergason's.  Her overall clinical picture is most consistent with chronic cervical spine issue with recent exacerbation with  adjunct cervical paraspinal muscle spasm.  I have ordered x-rays, advised relative rest from upper body activities, plan for patient initiated home-based rehab program, and medication management with scheduled meloxicam and low threshold to advance to prednisone; nightly baclofen as well.  She will return for follow-up in 2 weeks, pending clinical response and x-rays, can discuss further escalation of treatment to possibly include trigger point injections, referral to formal PT, advanced imaging, transition of pharmacotherapy.   Orders & Medications Meds ordered this encounter  Medications   meloxicam (MOBIC) 15 MG tablet    Sig: Take 1 tablet (15 mg total) by mouth daily.    Dispense:  14 tablet    Refill:  0   baclofen (LIORESAL) 10 MG tablet    Sig: Take 1 tablet (10 mg total) by mouth at bedtime.    Dispense:  14 each    Refill:  0   predniSONE (DELTASONE) 50 MG tablet    Sig: Take 1 tablet (50 mg total) by mouth daily.    Dispense:  5 tablet    Refill:  0   Orders Placed This Encounter  Procedures   DG Cervical Spine Complete     Return in about 2 weeks (around 10/19/2020).     Jerrol Banana, MD   Primary Care Sports Medicine [facility]

## 2020-10-06 ENCOUNTER — Encounter: Payer: Managed Care, Other (non HMO) | Admitting: Family Medicine

## 2020-10-07 NOTE — Progress Notes (Signed)
Please ensure patient is able to see below information:  Your neck x-rays show focal "wear and tear" degenerative changes at two levels of the lower neck spine as well as slightly more degenerative changes at one level of the spine on the left. This could absolutely account for your left sided symptoms we saw during your visit. These are normal findings that take place over time but when they are aggravated, they can manifest as the symptoms you are having. For now, stick with the plan outlined during our visit and maintain follow-up as scheduled. Reach out to me for any questions.

## 2020-10-14 ENCOUNTER — Encounter: Payer: Managed Care, Other (non HMO) | Admitting: Internal Medicine

## 2020-10-25 ENCOUNTER — Ambulatory Visit (INDEPENDENT_AMBULATORY_CARE_PROVIDER_SITE_OTHER): Payer: Managed Care, Other (non HMO) | Admitting: Family Medicine

## 2020-10-25 ENCOUNTER — Other Ambulatory Visit: Payer: Self-pay

## 2020-10-25 ENCOUNTER — Encounter: Payer: Self-pay | Admitting: Family Medicine

## 2020-10-25 VITALS — BP 132/82 | HR 69 | Temp 98.4°F | Ht 62.0 in | Wt 119.0 lb

## 2020-10-25 DIAGNOSIS — M4722 Other spondylosis with radiculopathy, cervical region: Secondary | ICD-10-CM

## 2020-10-25 MED ORDER — IBUPROFEN 800 MG PO TABS: 800.0000 mg | ORAL_TABLET | Freq: Three times a day (TID) | ORAL | 0 refills | Status: DC | PRN

## 2020-10-25 NOTE — Patient Instructions (Addendum)
-   Dose ibuprofen with food every 8 hours as-needed - Start physical therapy (referral coordinate will contact you) - Return in 4 weeks, contact for questions  [facility]: [place]: [phone]

## 2020-10-25 NOTE — Assessment & Plan Note (Signed)
Patient is demonstrated objective interval improvement with now painless cervical range of motion, her shoulder exam continues to be benign.  I reviewed the x-ray results with the patient and various strategies moving forward, she is amenable to formal physical therapy, home-based rehab, and close follow-up in 4 weeks for reassessment.  Suboptimal progress noted despite adherence to the above, anticipate advanced imaging, can consider local trigger point injections if indicated.

## 2020-10-25 NOTE — Progress Notes (Signed)
Primary Care / Sports Medicine Office Visit  Patient Information:  Patient ID: Erika Wall, female DOB: 1956-01-03 Age: 65 y.o. MRN: [id number]   Erika Wall is a pleasant 65 y.o. female presenting with the following:  Chief Complaint  Patient presents with   Follow-up   Radiculitis of left cervical region    X-ray 10/06/20; Initially did not notice any difference on meloxicam- by lunch time it would get worse; prednisone taper and baclofen dosed; now taking otc ibuprofen for relief when needed; works on the computer for hours at work and affecting left shoulder and arm; right-handedness; 2/10 pain; has improved since last visit    Review of Systems pertinent details above   Patient Active Problem List   Diagnosis Date Noted   Cervical spondylosis with radiculopathy 10/25/2020   Radiculitis of left cervical region 10/05/2020   Migraine headache with aura 05/26/2020   Hematuria 10/20/2019   TGA (transient global amnesia) 10/14/2019   Healthcare maintenance 09/09/2014   Other screening mammogram 10/31/2011   Osteopenia 10/31/2011   Dysuria 10/31/2011   Gynecological examination 08/02/2010   Routine general medical examination at a health care facility 08/02/2010   Hypercholesterolemia 08/02/2010   OSTEOARTHRITIS, HANDS, BILATERAL 05/31/2010   Past Medical History:  Diagnosis Date   Arthritis    Outpatient Encounter Medications as of 10/25/2020  Medication Sig   Biotin 1000 MCG tablet Take 1,000 mcg by mouth daily.   Cholecalciferol (VITAMIN D-3) 125 MCG (5000 UT) TABS Take by mouth.   COLLAGEN PO Take 2,500 mg by mouth.   fexofenadine (ALLEGRA) 180 MG tablet Take 180 mg by mouth as needed for allergies.    fish oil-omega-3 fatty acids 1000 MG capsule Take 1 g by mouth daily.   ibuprofen (ADVIL) 800 MG tablet Take 1 tablet (800 mg total) by mouth every 8 (eight) hours as needed.   Multiple Vitamin (MULTIVITAMIN) capsule Take 1 capsule by mouth daily.   triamcinolone  (NASACORT) 55 MCG/ACT AERO nasal inhaler Place 2 sprays into the nose daily as needed.   vitamin B-12 (CYANOCOBALAMIN) 1000 MCG tablet Take 1,000 mcg by mouth daily.   [DISCONTINUED] ibuprofen (ADVIL) 200 MG tablet Take 400-600 mg by mouth every 6 (six) hours as needed.   baclofen (LIORESAL) 10 MG tablet Take 1 tablet (10 mg total) by mouth at bedtime. (Patient not taking: Reported on 10/25/2020)   meloxicam (MOBIC) 15 MG tablet Take 1 tablet (15 mg total) by mouth daily. (Patient not taking: Reported on 10/25/2020)   [DISCONTINUED] phenazopyridine (PYRIDIUM) 200 MG tablet Take 1 tablet (200 mg total) by mouth 3 (three) times daily as needed for pain. (Patient not taking: Reported on 10/05/2020)   [DISCONTINUED] predniSONE (DELTASONE) 50 MG tablet Take 1 tablet (50 mg total) by mouth daily.   No facility-administered encounter medications on file as of 10/25/2020.   Past Surgical History:  Procedure Laterality Date   BREAST BIOPSY Right 12/20/1995   neg    Vitals:   10/25/20 1321  BP: 132/82  Pulse: 69  Temp: 98.4 F (36.9 C)  SpO2: 99%   Vitals:   10/25/20 1321  Weight: 119 lb (54 kg)  Height: 5\' 2"  (1.575 m)   Body mass index is 21.77 kg/m.  DG Cervical Spine Complete  Result Date: 10/06/2020 CLINICAL DATA:  Left-sided neck pain. EXAM: CERVICAL SPINE - COMPLETE 4+ VIEW COMPARISON:  None. FINDINGS: There is no evidence of cervical spine fracture or prevertebral soft tissue swelling. Alignment is normal.  Mild endplate sclerosis is seen at the levels of C5-C6 and C6-C7. Mild intervertebral disc space narrowing is also seen at these levels. No other significant bone abnormalities are identified. IMPRESSION: Mild degenerative changes at the levels of C5-C6 and C6-C7. Electronically Signed   By: Aram Candela M.D.   On: 10/06/2020 21:31     Independent interpretation of notes and tests performed by another provider:   Independent interpretation of cervical spine films dated 10/05/2020  reveal straightening of the expected cervical lordosis, focal intervertebral narrowing and associated degenerative changes at C6-7, sclerosis noted at C5-6, no acute osseous process identified  Procedures performed:   None  Pertinent History, Exam, Impression, and Recommendations:   Cervical spondylosis with radiculopathy Patient is demonstrated objective interval improvement with now painless cervical range of motion, her shoulder exam continues to be benign.  I reviewed the x-ray results with the patient and various strategies moving forward, she is amenable to formal physical therapy, home-based rehab, and close follow-up in 4 weeks for reassessment.  Suboptimal progress noted despite adherence to the above, anticipate advanced imaging, can consider local trigger point injections if indicated.    Orders & Medications Meds ordered this encounter  Medications   ibuprofen (ADVIL) 800 MG tablet    Sig: Take 1 tablet (800 mg total) by mouth every 8 (eight) hours as needed.    Dispense:  90 tablet    Refill:  0    Orders Placed This Encounter  Procedures   Ambulatory referral to Physical Therapy      Return in about 4 weeks (around 11/22/2020).     Jerrol Banana, MD   Primary Care Sports Medicine [facility]

## 2020-11-10 ENCOUNTER — Other Ambulatory Visit: Payer: Self-pay | Admitting: Internal Medicine

## 2020-11-10 DIAGNOSIS — Z1231 Encounter for screening mammogram for malignant neoplasm of breast: Secondary | ICD-10-CM

## 2020-11-23 ENCOUNTER — Ambulatory Visit: Payer: Managed Care, Other (non HMO) | Admitting: Family Medicine

## 2020-12-02 ENCOUNTER — Ambulatory Visit
Admission: RE | Admit: 2020-12-02 | Discharge: 2020-12-02 | Disposition: A | Discharge status: Home / Self Care | Payer: Managed Care, Other (non HMO) | Source: Ambulatory Visit | Attending: Internal Medicine | Admitting: Internal Medicine

## 2020-12-02 ENCOUNTER — Other Ambulatory Visit: Payer: Self-pay

## 2020-12-02 DIAGNOSIS — Z1231 Encounter for screening mammogram for malignant neoplasm of breast: Secondary | ICD-10-CM | POA: Diagnosis not present

## 2020-12-04 ENCOUNTER — Other Ambulatory Visit: Payer: Self-pay | Admitting: Family Medicine

## 2020-12-04 DIAGNOSIS — M4722 Other spondylosis with radiculopathy, cervical region: Secondary | ICD-10-CM

## 2020-12-22 ENCOUNTER — Encounter: Payer: Managed Care, Other (non HMO) | Admitting: Internal Medicine

## 2020-12-28 ENCOUNTER — Encounter: Payer: Self-pay | Admitting: Internal Medicine

## 2020-12-28 ENCOUNTER — Other Ambulatory Visit (HOSPITAL_COMMUNITY)
Admission: RE | Admit: 2020-12-28 | Discharge: 2020-12-28 | Disposition: A | Discharge status: Home / Self Care | Payer: Managed Care, Other (non HMO) | Source: Ambulatory Visit | Attending: Internal Medicine | Admitting: Internal Medicine

## 2020-12-28 ENCOUNTER — Other Ambulatory Visit: Payer: Self-pay

## 2020-12-28 ENCOUNTER — Ambulatory Visit (INDEPENDENT_AMBULATORY_CARE_PROVIDER_SITE_OTHER): Payer: Managed Care, Other (non HMO) | Admitting: Internal Medicine

## 2020-12-28 VITALS — BP 124/78 | HR 75 | Temp 97.9°F | Resp 16 | Ht 62.0 in | Wt 117.6 lb

## 2020-12-28 DIAGNOSIS — E7800 Pure hypercholesterolemia, unspecified: Secondary | ICD-10-CM

## 2020-12-28 DIAGNOSIS — G43109 Migraine with aura, not intractable, without status migrainosus: Secondary | ICD-10-CM | POA: Diagnosis not present

## 2020-12-28 DIAGNOSIS — Z124 Encounter for screening for malignant neoplasm of cervix: Secondary | ICD-10-CM

## 2020-12-28 DIAGNOSIS — Z0000 Encounter for general adult medical examination without abnormal findings: Secondary | ICD-10-CM

## 2020-12-28 LAB — CBC WITH DIFFERENTIAL/PLATELET
Basophils Absolute: 0 10*3/uL (ref 0.0–0.1)
Basophils Relative: 1.1 % (ref 0.0–3.0)
Eosinophils Absolute: 0.1 10*3/uL (ref 0.0–0.7)
Eosinophils Relative: 3.3 % (ref 0.0–5.0)
HCT: 38.6 % (ref 36.0–46.0)
Hemoglobin: 12.8 g/dL (ref 12.0–15.0)
Lymphocytes Relative: 32.6 % (ref 12.0–46.0)
Lymphs Abs: 1.4 10*3/uL (ref 0.7–4.0)
MCHC: 33.1 g/dL (ref 30.0–36.0)
MCV: 95.6 fl (ref 78.0–100.0)
Monocytes Absolute: 0.4 10*3/uL (ref 0.1–1.0)
Monocytes Relative: 8.8 % (ref 3.0–12.0)
Neutro Abs: 2.3 10*3/uL (ref 1.4–7.7)
Neutrophils Relative %: 54.2 % (ref 43.0–77.0)
Platelets: 243 10*3/uL (ref 150.0–400.0)
RBC: 4.03 Mil/uL (ref 3.87–5.11)
RDW: 13.8 % (ref 11.5–15.5)
WBC: 4.2 10*3/uL (ref 4.0–10.5)

## 2020-12-28 LAB — COMPREHENSIVE METABOLIC PANEL
ALT: 17 U/L (ref 0–35)
AST: 22 U/L (ref 0–37)
Albumin: 4.1 g/dL (ref 3.5–5.2)
Alkaline Phosphatase: 42 U/L (ref 39–117)
BUN: 18 mg/dL (ref 6–23)
CO2: 29 mEq/L (ref 19–32)
Calcium: 9.1 mg/dL (ref 8.4–10.5)
Chloride: 104 mEq/L (ref 96–112)
Creatinine, Ser: 0.85 mg/dL (ref 0.40–1.20)
GFR: 71.59 mL/min (ref >60.00)
Glucose, Bld: 89 mg/dL (ref 70–99)
Potassium: 4.2 mEq/L (ref 3.5–5.1)
Sodium: 140 mEq/L (ref 135–145)
Total Bilirubin: 0.7 mg/dL (ref 0.2–1.2)
Total Protein: 6.6 g/dL (ref 6.0–8.3)

## 2020-12-28 LAB — LIPID PANEL
Cholesterol: 217 mg/dL — ABNORMAL HIGH (ref 0–200)
HDL: 73.2 mg/dL (ref >39.00)
LDL Cholesterol: 125 mg/dL — ABNORMAL HIGH (ref 0–99)
NonHDL: 143.93
Total CHOL/HDL Ratio: 3
Triglycerides: 96 mg/dL (ref 0.0–149.0)
VLDL: 19.2 mg/dL (ref 0.0–40.0)

## 2020-12-28 LAB — TSH: TSH: 2.46 u[IU]/mL (ref 0.35–5.50)

## 2020-12-28 NOTE — Progress Notes (Signed)
Patient ID: Erika Wall, female   DOB: September 26, 1955, 65 y.o.   MRN: [id number]   Subjective:    Patient ID: Erika Wall, female    DOB: October 19, 1955, 65 y.o.   MRN: [id number]  This visit occurred during the SARS-CoV-2 public health emergency.  Safety protocols were in place, including screening questions prior to the visit, additional usage of staff PPE, and extensive cleaning of exam room while observing appropriate contact time as indicated for disinfecting solutions.   Patient here for her physical exam.   Chief Complaint  Patient presents with   Annual Exam   .   HPI Here for a physical exam.  She is doing well.  Exercising.  While working out previously she had a neck injury.  She was evaluated through sports medicine.  This is not an issue for her now.  No chest pain or tightness with increased activity or exertion.  No nausea or vomiting.  No abdominal pain.  Bowels moving.  Overall feels good.  Using estrogen cream.  This is helped her vaginal and urinary symptoms.  Feels much better since using it.   Past Medical History:  Diagnosis Date   Arthritis    Past Surgical History:  Procedure Laterality Date   BREAST BIOPSY Right 12/20/1995   neg   Family History  Problem Relation Age of Onset   Cancer Mother 16       uterine   Stroke Mother    Cancer Father        facial sinus cancer   Arthritis Maternal Grandmother    Breast cancer Neg Hx    Social History   Socioeconomic History   Marital status: Married    Spouse name: Not on file   Number of children: Not on file   Years of education: Not on file   Highest education level: Not on file  Occupational History   Occupation: Advertising account planner: [employer]  Tobacco Use   Smoking status: Never   Smokeless tobacco: Never  Substance and Sexual Activity   Alcohol use: No    Alcohol/week: 0.0 standard drinks   Drug use: No   Sexual activity: Not on file  Other Topics Concern   Not on file   Social History Narrative   Not on file   Social Determinants of Health   Financial Resource Strain: Not on file  Food Insecurity: Not on file  Transportation Needs: Not on file  Physical Activity: Not on file  Stress: Not on file  Social Connections: Not on file    Review of Systems  Constitutional:  Negative for appetite change and unexpected weight change.  HENT:  Negative for congestion, sinus pressure and sore throat.   Eyes:  Negative for pain and visual disturbance.  Respiratory:  Negative for cough, chest tightness and shortness of breath.   Cardiovascular:  Negative for chest pain and palpitations.  Gastrointestinal:  Negative for abdominal pain, diarrhea, nausea and vomiting.  Genitourinary:  Negative for difficulty urinating and dysuria.  Musculoskeletal:  Negative for joint swelling and myalgias.  Skin:  Negative for color change and rash.  Neurological:  Negative for dizziness, light-headedness and headaches.  Hematological:  Negative for adenopathy. Does not bruise/bleed easily.  Psychiatric/Behavioral:  Negative for decreased concentration and dysphoric mood.       Objective:     BP 124/78   Pulse 75   Temp 97.9 F (36.6 C)   Resp 16   Ht 5'  2" (1.575 m)   Wt 117 lb 9.6 oz (53.3 kg)   LMP 04/17/2005   SpO2 99%   BMI 21.51 kg/m  Wt Readings from Last 3 Encounters:  12/28/20 117 lb 9.6 oz (53.3 kg)  10/25/20 119 lb (54 kg)  10/05/20 119 lb (54 kg)    Physical Exam Vitals reviewed.  Constitutional:      General: She is not in acute distress.    Appearance: Normal appearance. She is well-developed.  HENT:     Head: Normocephalic and atraumatic.     Right Ear: External ear normal.     Left Ear: External ear normal.  Eyes:     General: No scleral icterus.       Right eye: No discharge.        Left eye: No discharge.     Conjunctiva/sclera: Conjunctivae normal.  Neck:     Thyroid: No thyromegaly.  Cardiovascular:     Rate and Rhythm: Normal  rate and regular rhythm.  Pulmonary:     Effort: No tachypnea, accessory muscle usage or respiratory distress.     Breath sounds: Normal breath sounds. No decreased breath sounds, wheezing or rhonchi.  Chest:  Breasts:    Right: No inverted nipple, mass, nipple discharge or tenderness (no axillary adenopathy).     Left: No inverted nipple, mass, nipple discharge or tenderness (no axilarry adenopathy).  Abdominal:     General: Bowel sounds are normal.     Palpations: Abdomen is soft.     Tenderness: There is no abdominal tenderness.  Genitourinary:    Comments: Normal external genitalia.  Vaginal vault without lesions.  Cervix identified.  Pap smear performed.  Could not appreciate any adnexal masses or tenderness.   Musculoskeletal:        General: No swelling or tenderness.     Cervical back: Neck supple.  Lymphadenopathy:     Cervical: No cervical adenopathy.  Skin:    General: Skin is warm.     Findings: No erythema or rash.  Neurological:     Mental Status: She is alert and oriented to person, place, and time.  Psychiatric:        Mood and Affect: Mood normal.        Behavior: Behavior normal.     Outpatient Encounter Medications as of 12/28/2020  Medication Sig   Biotin 1000 MCG tablet Take 1,000 mcg by mouth daily.   Cholecalciferol (VITAMIN D-3) 125 MCG (5000 UT) TABS Take by mouth.   COLLAGEN PO Take 2,500 mg by mouth.   fexofenadine (ALLEGRA) 180 MG tablet Take 180 mg by mouth as needed for allergies.    fish oil-omega-3 fatty acids 1000 MG capsule Take 1 g by mouth daily.   Multiple Vitamin (MULTIVITAMIN) capsule Take 1 capsule by mouth daily.   triamcinolone (NASACORT) 55 MCG/ACT AERO nasal inhaler Place 2 sprays into the nose daily as needed.   vitamin B-12 (CYANOCOBALAMIN) 1000 MCG tablet Take 1,000 mcg by mouth daily.   [DISCONTINUED] baclofen (LIORESAL) 10 MG tablet Take 1 tablet (10 mg total) by mouth at bedtime. (Patient not taking: Reported on 10/25/2020)    [DISCONTINUED] ibuprofen (ADVIL) 800 MG tablet TAKE 1 TABLET BY MOUTH EVERY 8 HOURS AS NEEDED   [DISCONTINUED] meloxicam (MOBIC) 15 MG tablet Take 1 tablet (15 mg total) by mouth daily. (Patient not taking: Reported on 10/25/2020)   No facility-administered encounter medications on file as of 12/28/2020.     Lab Results  Component Value Date  WBC 4.2 12/28/2020   HGB 12.8 12/28/2020   HCT 38.6 12/28/2020   PLT 243.0 12/28/2020   GLUCOSE 89 12/28/2020   CHOL 217 (H) 12/28/2020   TRIG 96.0 12/28/2020   HDL 73.20 12/28/2020   LDLDIRECT 119.2 10/31/2011   LDLCALC 125 (H) 12/28/2020   ALT 17 12/28/2020   AST 22 12/28/2020   NA 140 12/28/2020   K 4.2 12/28/2020   CL 104 12/28/2020   CREATININE 0.85 12/28/2020   BUN 18 12/28/2020   CO2 29 12/28/2020   TSH 2.46 12/28/2020    MM 3D SCREEN BREAST BILATERAL  Result Date: 12/03/2020 CLINICAL DATA:  Screening. EXAM: DIGITAL SCREENING BILATERAL MAMMOGRAM WITH TOMOSYNTHESIS AND CAD TECHNIQUE: Bilateral screening digital craniocaudal and mediolateral oblique mammograms were obtained. Bilateral screening digital breast tomosynthesis was performed. The images were evaluated with computer-aided detection. COMPARISON:  Previous exam(s). ACR Breast Density Category b: There are scattered areas of fibroglandular density. FINDINGS: There are no findings suspicious for malignancy. IMPRESSION: No mammographic evidence of malignancy. A result letter of this screening mammogram will be mailed directly to the patient. RECOMMENDATION: Screening mammogram in one year. (Code:SM-B-01Y) BI-RADS CATEGORY  1: Negative. Electronically Signed   By: Baird Lyons M.D.   On: 12/03/2020 09:36      Assessment & Plan:   Problem List Items Addressed This Visit     Healthcare maintenance    Physical today 12/28/20.  Mammogram 12/03/20 - Birads I.        Hypercholesterolemia    The 10-year ASCVD risk score (Arnett DK, et al., 2019) is: 5.5%   Values used to calculate the  score:     Age: 27 years     Sex: Female     Is Non-Hispanic African American: No     Diabetic: No     Tobacco smoker: No     Systolic Blood Pressure: 124 mmHg     Is BP treated: No     HDL Cholesterol: 73.2 mg/dL     Total Cholesterol: 217 mg/dL  Low cholesterol diet and exercise.  Follow lipid panel.        Relevant Orders   CBC with Differential/Platelet (Completed)   Comprehensive metabolic panel (Completed)   Lipid panel (Completed)   TSH (Completed)   Migraine headache with aura    Saw neurology 12/2019 - vitamin b2, magnesium.  Stable.       Routine general medical examination at a health care facility - Primary    Physical today 12/28/20.  PAP 12/28/20.  Obtain latest colonoscopy reporte.  Mammogram 12/03/20 - Birads I.       Other Visit Diagnoses     Cervical cancer screening       Relevant Orders   Cytology - PAP( [place]) (Completed)        Dale Browns, MD

## 2020-12-28 NOTE — Assessment & Plan Note (Signed)
Physical today 12/28/20.  Mammogram 12/03/20 - Birads I.

## 2020-12-30 LAB — CYTOLOGY - PAP
Comment: NEGATIVE
Diagnosis: NEGATIVE
High risk HPV: NEGATIVE

## 2021-01-02 ENCOUNTER — Encounter: Payer: Self-pay | Admitting: Internal Medicine

## 2021-01-02 NOTE — Assessment & Plan Note (Signed)
Physical today 12/28/20.  PAP 12/28/20.  Obtain latest colonoscopy reporte.  Mammogram 12/03/20 - Birads I.

## 2021-01-02 NOTE — Assessment & Plan Note (Signed)
Saw neurology 12/2019 - vitamin b2, magnesium.  Stable.

## 2021-01-02 NOTE — Assessment & Plan Note (Signed)
The 10-year ASCVD risk score (Arnett DK, et al., 2019) is: 5.5%   Values used to calculate the score:     Age: 65 years     Sex: Female     Is Non-Hispanic African American: No     Diabetic: No     Tobacco smoker: No     Systolic Blood Pressure: 124 mmHg     Is BP treated: No     HDL Cholesterol: 73.2 mg/dL     Total Cholesterol: 217 mg/dL  Low cholesterol diet and exercise.  Follow lipid panel.

## 2021-03-25 ENCOUNTER — Encounter: Payer: Self-pay | Admitting: Internal Medicine

## 2021-03-25 ENCOUNTER — Telehealth: Payer: Managed Care, Other (non HMO) | Admitting: Physician Assistant

## 2021-03-25 DIAGNOSIS — B9689 Other specified bacterial agents as the cause of diseases classified elsewhere: Secondary | ICD-10-CM | POA: Diagnosis not present

## 2021-03-25 DIAGNOSIS — J019 Acute sinusitis, unspecified: Secondary | ICD-10-CM | POA: Diagnosis not present

## 2021-03-25 MED ORDER — AMOXICILLIN-POT CLAVULANATE 875-125 MG PO TABS: 1.0000 | ORAL_TABLET | Freq: Two times a day (BID) | ORAL | 0 refills | Status: DC

## 2021-03-25 NOTE — Telephone Encounter (Signed)
Pt evaluated.  

## 2021-03-25 NOTE — Progress Notes (Signed)

## 2021-03-25 NOTE — Telephone Encounter (Signed)
Patient says she has had this for X 3 weeks just had a lot of nasal congestion , no color to it , just she has had a lot of sinus congestion, advised patient needs to be evaluate no appt in office but to schedule E visit through my chart patient is scheduling Evisit and ha sappt to FU with PCP  on 03/31/21.

## 2021-03-25 NOTE — Progress Notes (Signed)
I have spent 5 minutes in review of e-visit questionnaire, review and updating patient chart, medical decision making and response to patient.   Tashanna Dolin Cody Catherene Kaleta, PA-C    

## 2021-03-31 ENCOUNTER — Ambulatory Visit: Payer: Managed Care, Other (non HMO) | Admitting: Internal Medicine

## 2021-04-08 ENCOUNTER — Telehealth: Payer: Self-pay | Admitting: Internal Medicine

## 2021-04-08 NOTE — Telephone Encounter (Signed)
Patient was on antibiotics finished them on Sunday and is now having discharge, odor and burning. A little itching. Recommended per Dr Lorin Picket monistat 7- 1 applicator qhs for 7 days. Patient will let me know if this does not clear up

## 2021-04-08 NOTE — Telephone Encounter (Signed)
Patient just finished a antibiotic on Sunday and she now has a yeast infection. She was wondering if Dr Lorin Picket would call something in for her.

## 2021-06-06 ENCOUNTER — Encounter: Payer: Self-pay | Admitting: Internal Medicine

## 2021-06-09 NOTE — Telephone Encounter (Signed)
I reviewed chart.  It does appear that Dr Erlene Quan started the medication.  I do not have a problem refilling the medication as long as she has had no problems taking.

## 2021-06-15 ENCOUNTER — Other Ambulatory Visit: Payer: Self-pay

## 2021-06-15 MED ORDER — ESTROGENS CONJUGATED 0.625 MG/GM VA CREA: TOPICAL_CREAM | VAGINAL | 1 refills | Status: DC

## 2021-12-28 ENCOUNTER — Other Ambulatory Visit (HOSPITAL_COMMUNITY)
Admission: RE | Admit: 2021-12-28 | Discharge: 2021-12-28 | Disposition: A | Discharge status: Home / Self Care | Payer: Managed Care, Other (non HMO) | Source: Ambulatory Visit | Attending: Internal Medicine | Admitting: Internal Medicine

## 2021-12-28 ENCOUNTER — Ambulatory Visit (INDEPENDENT_AMBULATORY_CARE_PROVIDER_SITE_OTHER): Payer: Managed Care, Other (non HMO) | Admitting: Internal Medicine

## 2021-12-28 ENCOUNTER — Encounter: Payer: Self-pay | Admitting: Internal Medicine

## 2021-12-28 VITALS — BP 128/70 | HR 79 | Temp 98.4°F | Ht 62.0 in | Wt 116.0 lb

## 2021-12-28 DIAGNOSIS — N898 Other specified noninflammatory disorders of vagina: Secondary | ICD-10-CM

## 2021-12-28 DIAGNOSIS — Z1231 Encounter for screening mammogram for malignant neoplasm of breast: Secondary | ICD-10-CM | POA: Diagnosis not present

## 2021-12-28 DIAGNOSIS — Z124 Encounter for screening for malignant neoplasm of cervix: Secondary | ICD-10-CM

## 2021-12-28 DIAGNOSIS — E7800 Pure hypercholesterolemia, unspecified: Secondary | ICD-10-CM | POA: Diagnosis not present

## 2021-12-28 DIAGNOSIS — Z0000 Encounter for general adult medical examination without abnormal findings: Secondary | ICD-10-CM

## 2021-12-28 DIAGNOSIS — Z1211 Encounter for screening for malignant neoplasm of colon: Secondary | ICD-10-CM | POA: Diagnosis not present

## 2021-12-28 LAB — LIPID PANEL
Cholesterol: 234 mg/dL — ABNORMAL HIGH (ref 0–200)
HDL: 87.7 mg/dL (ref >39.00)
LDL Cholesterol: 130 mg/dL — ABNORMAL HIGH (ref 0–99)
NonHDL: 145.87
Total CHOL/HDL Ratio: 3
Triglycerides: 81 mg/dL (ref 0.0–149.0)
VLDL: 16.2 mg/dL (ref 0.0–40.0)

## 2021-12-28 LAB — TSH: TSH: 2.29 u[IU]/mL (ref 0.35–5.50)

## 2021-12-28 LAB — COMPREHENSIVE METABOLIC PANEL
ALT: 19 U/L (ref 0–35)
AST: 20 U/L (ref 0–37)
Albumin: 4.3 g/dL (ref 3.5–5.2)
Alkaline Phosphatase: 43 U/L (ref 39–117)
BUN: 16 mg/dL (ref 6–23)
CO2: 27 mEq/L (ref 19–32)
Calcium: 9.4 mg/dL (ref 8.4–10.5)
Chloride: 104 mEq/L (ref 96–112)
Creatinine, Ser: 0.9 mg/dL (ref 0.40–1.20)
GFR: 66.37 mL/min (ref >60.00)
Glucose, Bld: 85 mg/dL (ref 70–99)
Potassium: 4.1 mEq/L (ref 3.5–5.1)
Sodium: 140 mEq/L (ref 135–145)
Total Bilirubin: 0.8 mg/dL (ref 0.2–1.2)
Total Protein: 6.6 g/dL (ref 6.0–8.3)

## 2021-12-28 LAB — CBC WITH DIFFERENTIAL/PLATELET
Basophils Absolute: 0 10*3/uL (ref 0.0–0.1)
Basophils Relative: 0.8 % (ref 0.0–3.0)
Eosinophils Absolute: 0.1 10*3/uL (ref 0.0–0.7)
Eosinophils Relative: 2.2 % (ref 0.0–5.0)
HCT: 38 % (ref 36.0–46.0)
Hemoglobin: 12.6 g/dL (ref 12.0–15.0)
Lymphocytes Relative: 34.5 % (ref 12.0–46.0)
Lymphs Abs: 1.6 10*3/uL (ref 0.7–4.0)
MCHC: 33.1 g/dL (ref 30.0–36.0)
MCV: 95.4 fl (ref 78.0–100.0)
Monocytes Absolute: 0.4 10*3/uL (ref 0.1–1.0)
Monocytes Relative: 8.2 % (ref 3.0–12.0)
Neutro Abs: 2.5 10*3/uL (ref 1.4–7.7)
Neutrophils Relative %: 54.3 % (ref 43.0–77.0)
Platelets: 236 10*3/uL (ref 150.0–400.0)
RBC: 3.99 Mil/uL (ref 3.87–5.11)
RDW: 13.9 % (ref 11.5–15.5)
WBC: 4.6 10*3/uL (ref 4.0–10.5)

## 2021-12-28 MED ORDER — ESTROGENS CONJUGATED 0.625 MG/GM VA CREA: TOPICAL_CREAM | VAGINAL | 1 refills | Status: DC

## 2021-12-28 NOTE — Assessment & Plan Note (Addendum)
Physical today 12/28/21.  Mammogram 12/03/20 - Birads I.  Scheduled f/u mammogram - 01/09/22 - 11:40. PAP 12/2020 - atrophy changes.  Negative HPV. PAP - 12/28/21.  Colonoscopy 02/2008 - normal. Referred to GI 2019 - colonoscopy 03/2018.

## 2021-12-28 NOTE — Progress Notes (Signed)
Patient ID: Erika Wall, female   DOB: [birth year], 66 y.o.   MRN: [id number]   Subjective:    Patient ID: Erika Wall, female    DOB: 14-Feb-1956, 66 y.o.   MRN: [id number]   Patient here for  Chief Complaint  Patient presents with   Annual Exam    Physical    .   HPI Here for physical exam. Is doing well.  Stays active.  No chest pain or sob.  No acid reflux.  No abdominal pain.  Bowels moving.  Some vaginal irritation. Discussed premarin cream.    Past Medical History:  Diagnosis Date   Arthritis    Past Surgical History:  Procedure Laterality Date   BREAST BIOPSY Right 12/20/1995   neg   Family History  Problem Relation Age of Onset   Cancer Mother 25       uterine   Stroke Mother    Cancer Father        facial sinus cancer   Arthritis Maternal Grandmother    Breast cancer Neg Hx    Social History   Socioeconomic History   Marital status: Married    Spouse name: Not on file   Number of children: Not on file   Years of education: Not on file   Highest education level: Not on file  Occupational History   Occupation: Advertising account planner: [employer]  Tobacco Use   Smoking status: Never   Smokeless tobacco: Never  Substance and Sexual Activity   Alcohol use: No    Alcohol/week: 0.0 standard drinks of alcohol   Drug use: No   Sexual activity: Not on file  Other Topics Concern   Not on file  Social History Narrative   Not on file   Social Determinants of Health   Financial Resource Strain: Not on file  Food Insecurity: Not on file  Transportation Needs: Not on file  Physical Activity: Not on file  Stress: Not on file  Social Connections: Not on file     Review of Systems  Constitutional:  Negative for fatigue and unexpected weight change.  HENT:  Negative for congestion, sinus pressure and sore throat.   Eyes:  Negative for pain and visual disturbance.  Respiratory:  Negative for cough, chest tightness and shortness of  breath.   Cardiovascular:  Negative for chest pain and palpitations.  Gastrointestinal:  Negative for abdominal pain, diarrhea, nausea and vomiting.  Genitourinary:  Negative for difficulty urinating and dysuria.  Musculoskeletal:  Negative for back pain and joint swelling.  Skin:  Negative for color change and rash.  Neurological:  Negative for dizziness and headaches.  Hematological:  Negative for adenopathy. Does not bruise/bleed easily.  Psychiatric/Behavioral:  Negative for decreased concentration and dysphoric mood.        Objective:     BP 128/70 (BP Location: Left Arm, Patient Position: Sitting, Cuff Size: Normal)   Pulse 79   Temp 98.4 F (36.9 C) (Oral)   Ht 5\' 2"  (1.575 m)   Wt 116 lb (52.6 kg)   LMP 04/17/2005   SpO2 98%   BMI 21.22 kg/m  Wt Readings from Last 3 Encounters:  12/28/21 116 lb (52.6 kg)  12/28/20 117 lb 9.6 oz (53.3 kg)  10/25/20 119 lb (54 kg)    Physical Exam Vitals reviewed.  Constitutional:      General: She is not in acute distress.    Appearance: Normal appearance. She is well-developed.  HENT:  Head: Normocephalic and atraumatic.     Right Ear: External ear normal.     Left Ear: External ear normal.  Eyes:     General: No scleral icterus.       Right eye: No discharge.        Left eye: No discharge.     Conjunctiva/sclera: Conjunctivae normal.  Neck:     Thyroid: No thyromegaly.  Cardiovascular:     Rate and Rhythm: Normal rate and regular rhythm.  Pulmonary:     Effort: No tachypnea, accessory muscle usage or respiratory distress.     Breath sounds: Normal breath sounds. No decreased breath sounds or wheezing.  Chest:  Breasts:    Right: No inverted nipple, mass, nipple discharge or tenderness (no axillary adenopathy).     Left: No inverted nipple, mass, nipple discharge or tenderness (no axilarry adenopathy).  Abdominal:     General: Bowel sounds are normal.     Palpations: Abdomen is soft.     Tenderness: There is no  abdominal tenderness.  Genitourinary:    Comments: Normal external genitalia.  Vaginal vault without lesions.  Cervix identified.  Pap smear performed.  Could not appreciate any adnexal masses or tenderness.   Musculoskeletal:        General: No swelling or tenderness.     Cervical back: Neck supple.  Lymphadenopathy:     Cervical: No cervical adenopathy.  Skin:    Findings: No erythema or rash.  Neurological:     Mental Status: She is alert and oriented to person, place, and time.  Psychiatric:        Mood and Affect: Mood normal.        Behavior: Behavior normal.      Outpatient Encounter Medications as of 12/28/2021  Medication Sig   Biotin 1000 MCG tablet Take 1,000 mcg by mouth daily.   Cholecalciferol (VITAMIN D-3) 125 MCG (5000 UT) TABS Take by mouth.   COLLAGEN PO Take 2,500 mg by mouth.   fish oil-omega-3 fatty acids 1000 MG capsule Take 1 g by mouth daily.   Multiple Vitamin (MULTIVITAMIN) capsule Take 1 capsule by mouth daily.   triamcinolone (NASACORT) 55 MCG/ACT AERO nasal inhaler Place 2 sprays into the nose daily as needed.   vitamin B-12 (CYANOCOBALAMIN) 1000 MCG tablet Take 1,000 mcg by mouth daily.   [DISCONTINUED] conjugated estrogens (PREMARIN) vaginal cream Use pea size amount vaginally 3 days per week   conjugated estrogens (PREMARIN) vaginal cream Use pea size amount vaginally 3 days per week   [DISCONTINUED] amoxicillin-clavulanate (AUGMENTIN) 875-125 MG tablet Take 1 tablet by mouth 2 (two) times daily.   [DISCONTINUED] fexofenadine (ALLEGRA) 180 MG tablet Take 180 mg by mouth as needed for allergies.    No facility-administered encounter medications on file as of 12/28/2021.     Lab Results  Component Value Date   WBC 4.6 12/28/2021   HGB 12.6 12/28/2021   HCT 38.0 12/28/2021   PLT 236.0 12/28/2021   GLUCOSE 85 12/28/2021   CHOL 234 (H) 12/28/2021   TRIG 81.0 12/28/2021   HDL 87.70 12/28/2021   LDLDIRECT 119.2 10/31/2011   LDLCALC 130 (H)  12/28/2021   ALT 19 12/28/2021   AST 20 12/28/2021   NA 140 12/28/2021   K 4.1 12/28/2021   CL 104 12/28/2021   CREATININE 0.90 12/28/2021   BUN 16 12/28/2021   CO2 27 12/28/2021   TSH 2.29 12/28/2021       Assessment & Plan:   Problem List  Items Addressed This Visit     Healthcare maintenance    Physical today 12/28/21.  Mammogram 12/03/20 - Birads I.  Scheduled f/u mammogram - 01/09/22 - 11:40. PAP 12/2020 - atrophy changes.  Negative HPV. PAP - 12/28/21.  Colonoscopy 02/2008 - normal. Referred to GI 2019 - colonoscopy 03/2018.        Hypercholesterolemia    The 10-year ASCVD risk score (Arnett DK, et al., 2019) is: 6.3%   Values used to calculate the score:     Age: 83 years     Sex: Female     Is Non-Hispanic African American: No     Diabetic: No     Tobacco smoker: No     Systolic Blood Pressure: 128 mmHg     Is BP treated: No     HDL Cholesterol: 87.7 mg/dL     Total Cholesterol: 234 mg/dL  Low cholesterol diet and exercise.  Follow lipid panel.        Relevant Orders   TSH (Completed)   CBC with Differential/Platelet (Completed)   Lipid panel (Completed)   Comprehensive metabolic panel (Completed)   Routine general medical examination at a health care facility - Primary   Vaginal irritation    Discussed.  Wet prep obtained.  Refill estrogen cream.  Follow.       Other Visit Diagnoses     Encounter for screening mammogram for malignant neoplasm of breast       Relevant Orders   MM 3D SCREEN BREAST BILATERAL   Screening for colon cancer       Screening for cervical cancer       Relevant Orders   Cytology - PAP( [place]) (Completed)   Vaginal discharge       Relevant Orders   Cervicovaginal ancillary only( [place]) (Completed)        Dale [place], MD

## 2021-12-28 NOTE — Patient Instructions (Signed)
Mammogram - 01/09/22 - 11:40.

## 2021-12-29 LAB — CERVICOVAGINAL ANCILLARY ONLY
Bacterial Vaginitis (gardnerella): NEGATIVE
Candida Glabrata: NEGATIVE
Candida Vaginitis: NEGATIVE
Chlamydia: NEGATIVE
Comment: NEGATIVE
Comment: NEGATIVE
Comment: NEGATIVE
Comment: NEGATIVE
Comment: NORMAL
Neisseria Gonorrhea: NEGATIVE

## 2022-01-02 LAB — CYTOLOGY - PAP
Comment: NEGATIVE
Diagnosis: NEGATIVE
High risk HPV: NEGATIVE

## 2022-01-07 ENCOUNTER — Encounter: Payer: Self-pay | Admitting: Internal Medicine

## 2022-01-07 DIAGNOSIS — N898 Other specified noninflammatory disorders of vagina: Secondary | ICD-10-CM | POA: Insufficient documentation

## 2022-01-07 NOTE — Assessment & Plan Note (Signed)
Discussed.  Wet prep obtained.  Refill estrogen cream.  Follow.

## 2022-01-07 NOTE — Assessment & Plan Note (Signed)
The 10-year ASCVD risk score (Arnett DK, et al., 2019) is: 6.3%   Values used to calculate the score:     Age: 66 years     Sex: Female     Is Non-Hispanic African American: No     Diabetic: No     Tobacco smoker: No     Systolic Blood Pressure: 768 mmHg     Is BP treated: No     HDL Cholesterol: 87.7 mg/dL     Total Cholesterol: 234 mg/dL  Low cholesterol diet and exercise.  Follow lipid panel.

## 2022-01-09 ENCOUNTER — Ambulatory Visit
Admission: RE | Admit: 2022-01-09 | Discharge: 2022-01-09 | Disposition: A | Discharge status: Home / Self Care | Payer: Managed Care, Other (non HMO) | Source: Ambulatory Visit | Attending: Internal Medicine | Admitting: Internal Medicine

## 2022-01-09 DIAGNOSIS — Z1231 Encounter for screening mammogram for malignant neoplasm of breast: Secondary | ICD-10-CM | POA: Insufficient documentation

## 2022-09-27 ENCOUNTER — Encounter: Payer: Self-pay | Admitting: Internal Medicine

## 2022-09-29 NOTE — Telephone Encounter (Signed)
Patient has been scheduled with Dr Lorin Picket to discuss. Pt having increased fatigue and noted a palpable area in lower abdomen that has been there for a couple months.

## 2022-10-02 ENCOUNTER — Ambulatory Visit: Payer: Managed Care, Other (non HMO) | Admitting: Family

## 2022-10-05 ENCOUNTER — Ambulatory Visit: Payer: Managed Care, Other (non HMO)

## 2022-10-05 ENCOUNTER — Encounter: Payer: Self-pay | Admitting: Internal Medicine

## 2022-10-05 ENCOUNTER — Ambulatory Visit: Payer: Managed Care, Other (non HMO) | Admitting: Internal Medicine

## 2022-10-05 VITALS — BP 112/70 | HR 88 | Temp 97.9°F | Resp 16 | Ht 62.5 in | Wt 118.8 lb

## 2022-10-05 DIAGNOSIS — R198 Other specified symptoms and signs involving the digestive system and abdomen: Secondary | ICD-10-CM | POA: Diagnosis not present

## 2022-10-05 DIAGNOSIS — R5383 Other fatigue: Secondary | ICD-10-CM

## 2022-10-05 DIAGNOSIS — E7800 Pure hypercholesterolemia, unspecified: Secondary | ICD-10-CM

## 2022-10-05 LAB — BASIC METABOLIC PANEL
BUN: 20 mg/dL (ref 6–23)
CO2: 27 mEq/L (ref 19–32)
Calcium: 8.8 mg/dL (ref 8.4–10.5)
Chloride: 106 mEq/L (ref 96–112)
Creatinine, Ser: 0.91 mg/dL (ref 0.40–1.20)
GFR: 65.15 mL/min (ref >60.00)
Glucose, Bld: 96 mg/dL (ref 70–99)
Potassium: 4.2 mEq/L (ref 3.5–5.1)
Sodium: 140 mEq/L (ref 135–145)

## 2022-10-05 LAB — LIPID PANEL
Cholesterol: 218 mg/dL — ABNORMAL HIGH (ref 0–200)
HDL: 70.6 mg/dL (ref >39.00)
LDL Cholesterol: 136 mg/dL — ABNORMAL HIGH (ref 0–99)
NonHDL: 147.54
Total CHOL/HDL Ratio: 3
Triglycerides: 60 mg/dL (ref 0.0–149.0)
VLDL: 12 mg/dL (ref 0.0–40.0)

## 2022-10-05 LAB — CBC WITH DIFFERENTIAL/PLATELET
Basophils Absolute: 0.1 10*3/uL (ref 0.0–0.1)
Basophils Relative: 1.1 % (ref 0.0–3.0)
Eosinophils Absolute: 0.1 10*3/uL (ref 0.0–0.7)
Eosinophils Relative: 3.1 % (ref 0.0–5.0)
HCT: 39.7 % (ref 36.0–46.0)
Hemoglobin: 13.1 g/dL (ref 12.0–15.0)
Lymphocytes Relative: 33.7 % (ref 12.0–46.0)
Lymphs Abs: 1.6 10*3/uL (ref 0.7–4.0)
MCHC: 33.1 g/dL (ref 30.0–36.0)
MCV: 94.5 fl (ref 78.0–100.0)
Monocytes Absolute: 0.4 10*3/uL (ref 0.1–1.0)
Monocytes Relative: 8.9 % (ref 3.0–12.0)
Neutro Abs: 2.5 10*3/uL (ref 1.4–7.7)
Neutrophils Relative %: 53.2 % (ref 43.0–77.0)
Platelets: 260 10*3/uL (ref 150.0–400.0)
RBC: 4.2 Mil/uL (ref 3.87–5.11)
RDW: 13.5 % (ref 11.5–15.5)
WBC: 4.8 10*3/uL (ref 4.0–10.5)

## 2022-10-05 LAB — LIPASE: Lipase: 22 U/L (ref 11.0–59.0)

## 2022-10-05 LAB — HEPATIC FUNCTION PANEL
ALT: 15 U/L (ref 0–35)
AST: 22 U/L (ref 0–37)
Albumin: 4.3 g/dL (ref 3.5–5.2)
Alkaline Phosphatase: 46 U/L (ref 39–117)
Bilirubin, Direct: 0.1 mg/dL (ref 0.0–0.3)
Total Bilirubin: 0.5 mg/dL (ref 0.2–1.2)
Total Protein: 6.9 g/dL (ref 6.0–8.3)

## 2022-10-05 LAB — TSH: TSH: 1.87 u[IU]/mL (ref 0.35–5.50)

## 2022-10-05 NOTE — Progress Notes (Signed)
Subjective:    Patient ID: Erika Wall, female    DOB: Aug 23, 1955, 67 y.o.   MRN: [id number]  Patient here for  Chief Complaint  Patient presents with   Medical Management of Chronic Issues    HPI Work in appt. Has noticed increased fatigue and feeling cold all of the time.  Was concerned her thyroid may be off. Also reports some issues with her bowels - Takes magnesium - helps.  Started benefiber 2-3 weeks ago. Does describe soft stool.  She does feel she sleeps well.  Feels rested in the morning.  Is fatigued in the evening.  Notices after eating - more in the evening - abdomen will feel more firm.  Feels has gained weight in lower abdomen.  Also some epigastric pain after eating.  Some flutter sensation - right lower quadrant - in the soft tissue.  Has stopped probiotics and supplements.  Wanted to see if this made any difference in her symptoms.  No blood in the stool.  No vomiting.    Past Medical History:  Diagnosis Date   Arthritis    Past Surgical History:  Procedure Laterality Date   BREAST BIOPSY Right 12/20/1995   neg   Family History  Problem Relation Age of Onset   Cancer Mother 39       uterine   Stroke Mother    Cancer Father        facial sinus cancer   Arthritis Maternal Grandmother    Breast cancer Neg Hx    Social History   Socioeconomic History   Marital status: Married    Spouse name: Not on file   Number of children: Not on file   Years of education: Not on file   Highest education level: 12th grade  Occupational History   Occupation: Advertising account planner: [employer]  Tobacco Use   Smoking status: Never   Smokeless tobacco: Never  Substance and Sexual Activity   Alcohol use: No    Alcohol/week: 0.0 standard drinks of alcohol   Drug use: No   Sexual activity: Not on file  Other Topics Concern   Not on file  Social History Narrative   Not on file   Social Determinants of Health   Financial Resource Strain: Low  Risk  (09/29/2022)   Overall Financial Resource Strain (CARDIA)    Difficulty of Paying Living Expenses: Not hard at all  Food Insecurity: No Food Insecurity (09/29/2022)   Hunger Vital Sign    Worried About Running Out of Food in the Last Year: Never true    Ran Out of Food in the Last Year: Never true  Transportation Needs: No Transportation Needs (09/29/2022)   PRAPARE - Administrator, Civil Service (Medical): No    Lack of Transportation (Non-Medical): No  Physical Activity: Sufficiently Active (09/29/2022)   Exercise Vital Sign    Days of Exercise per Week: 7 days    Minutes of Exercise per Session: 50 min  Stress: No Stress Concern Present (09/29/2022)   [employer] of Occupational Health - Occupational Stress Questionnaire    Feeling of Stress : Not at all  Social Connections: Socially Integrated (09/29/2022)   Social Connection and Isolation Panel [NHANES]    Frequency of Communication with Friends and Family: More than three times a week    Frequency of Social Gatherings with Friends and Family: More than three times a week    Attends Religious Services: More than 4  times per year    Active Member of Clubs or Organizations: Yes    Attends Banker Meetings: More than 4 times per year    Marital Status: Married     Review of Systems  Constitutional:  Positive for fatigue. Negative for appetite change and fever.  HENT:  Negative for congestion and sinus pressure.   Respiratory:  Negative for cough, chest tightness and shortness of breath.   Cardiovascular:  Negative for chest pain, palpitations and leg swelling.  Gastrointestinal:  Negative for nausea and vomiting.       Abdominal fullness and epigastric discomfort as outlined.   Genitourinary:  Negative for difficulty urinating and dysuria.  Musculoskeletal:  Negative for joint swelling and myalgias.  Skin:  Negative for color change and rash.  Neurological:  Negative for dizziness and headaches.   Psychiatric/Behavioral:  Negative for agitation and dysphoric mood.        Objective:     BP 112/70   Pulse 88   Temp 97.9 F (36.6 C)   Resp 16   Ht 5' 2.5" (1.588 m)   Wt 118 lb 12.8 oz (53.9 kg)   LMP 04/17/2005   SpO2 97%   BMI 21.38 kg/m  Wt Readings from Last 3 Encounters:  10/05/22 118 lb 12.8 oz (53.9 kg)  12/28/21 116 lb (52.6 kg)  12/28/20 117 lb 9.6 oz (53.3 kg)    Physical Exam Vitals reviewed.  Constitutional:      General: She is not in acute distress.    Appearance: Normal appearance.  HENT:     Head: Normocephalic and atraumatic.     Right Ear: External ear normal.     Left Ear: External ear normal.  Eyes:     General: No scleral icterus.       Right eye: No discharge.        Left eye: No discharge.     Conjunctiva/sclera: Conjunctivae normal.  Neck:     Thyroid: No thyromegaly.  Cardiovascular:     Rate and Rhythm: Normal rate and regular rhythm.  Pulmonary:     Effort: No respiratory distress.     Breath sounds: Normal breath sounds. No wheezing.  Abdominal:     General: Bowel sounds are normal.     Palpations: Abdomen is soft.     Comments: Some tenderness to palpation - epigastric region.  Could not appreciate definite hernia - right lower quadrant/right inguinal region.  Non tender in this area.   Musculoskeletal:        General: No swelling or tenderness.     Cervical back: Neck supple. No tenderness.  Lymphadenopathy:     Cervical: No cervical adenopathy.  Skin:    Findings: No erythema or rash.  Neurological:     Mental Status: She is alert.  Psychiatric:        Mood and Affect: Mood normal.        Behavior: Behavior normal.      Outpatient Encounter Medications as of 10/05/2022  Medication Sig   Biotin 1000 MCG tablet Take 1,000 mcg by mouth daily.   Cholecalciferol (VITAMIN D-3) 125 MCG (5000 UT) TABS Take by mouth.   COLLAGEN PO Take 2,500 mg by mouth.   conjugated estrogens (PREMARIN) vaginal cream Use pea size amount  vaginally 3 days per week   Multiple Vitamin (MULTIVITAMIN) capsule Take 1 capsule by mouth daily.   triamcinolone (NASACORT) 55 MCG/ACT AERO nasal inhaler Place 2 sprays into the nose daily as  needed.   vitamin B-12 (CYANOCOBALAMIN) 1000 MCG tablet Take 1,000 mcg by mouth daily.   [DISCONTINUED] fish oil-omega-3 fatty acids 1000 MG capsule Take 1 g by mouth daily.   No facility-administered encounter medications on file as of 10/05/2022.     Lab Results  Component Value Date   WBC 4.8 10/05/2022   HGB 13.1 10/05/2022   HCT 39.7 10/05/2022   PLT 260.0 10/05/2022   GLUCOSE 96 10/05/2022   CHOL 218 (H) 10/05/2022   TRIG 60.0 10/05/2022   HDL 70.60 10/05/2022   LDLDIRECT 119.2 10/31/2011   LDLCALC 136 (H) 10/05/2022   ALT 15 10/05/2022   AST 22 10/05/2022   NA 140 10/05/2022   K 4.2 10/05/2022   CL 106 10/05/2022   CREATININE 0.91 10/05/2022   BUN 20 10/05/2022   CO2 27 10/05/2022   TSH 1.87 10/05/2022    MM 3D SCREEN BREAST BILATERAL  Result Date: 01/10/2022 CLINICAL DATA:  Screening. EXAM: DIGITAL SCREENING BILATERAL MAMMOGRAM WITH TOMOSYNTHESIS AND CAD TECHNIQUE: Bilateral screening digital craniocaudal and mediolateral oblique mammograms were obtained. Bilateral screening digital breast tomosynthesis was performed. The images were evaluated with computer-aided detection. COMPARISON:  Previous exam(s). ACR Breast Density Category b: There are scattered areas of fibroglandular density. FINDINGS: There are no findings suspicious for malignancy. IMPRESSION: No mammographic evidence of malignancy. A result letter of this screening mammogram will be mailed directly to the patient. RECOMMENDATION: Screening mammogram in one year. (Code:SM-B-01Y) BI-RADS CATEGORY  1: Negative. Electronically Signed   By: Edwin Cap M.D.   On: 01/10/2022 08:50       Assessment & Plan:  Other fatigue Assessment & Plan: Symptoms as outlined.  She is concerned regarding possible thyroid issues.   Check labs as outlined.  Sleeping ok.    Orders: -     CBC with Differential/Platelet -     Basic metabolic panel -     Hepatic function panel -     TSH  Hypercholesterolemia Assessment & Plan: The 10-year ASCVD risk score (Arnett DK, et al., 2019) is: 5.1%   Values used to calculate the score:     Age: 2 years     Sex: Female     Is Non-Hispanic African American: No     Diabetic: No     Tobacco smoker: No     Systolic Blood Pressure: 112 mmHg     Is BP treated: No     HDL Cholesterol: 70.6 mg/dL     Total Cholesterol: 218 mg/dL  Low cholesterol diet and exercise.  Follow lipid panel.    Orders: -     Lipid panel  Abdominal fullness Assessment & Plan: Describes fullness and abdomen feeling more firm in pm - notices more after eating evening meal.  Some soft stool.  Started benefiber.  Also epigastric discomfort after eating.  Will obtain cbc, metablolic panel, liver panel and lipase.  Check KUB to confirm no large stool burden.  Could not appreciate hernia on exam.  Discussed possible CT scan - pending above.  Also discussed GI referral.   Orders: -     Lipase -     DG Abd 1 View; Future -     CT ABDOMEN PELVIS W CONTRAST; Future -     Ambulatory referral to Gastroenterology  Change in bowel movement Assessment & Plan: Describes fullness and abdomen feeling more firm in pm - notices more after eating evening meal.  Some soft stool.  Started benefiber.  Also epigastric discomfort  after eating.  Will obtain cbc, metablolic panel, liver panel and lipase.  Check KUB to confirm no large stool burden.  Could not appreciate hernia on exam.  Discussed possible CT scan - pending above.  Also discussed GI referral.   Orders: -     DG Abd 1 View; Future -     Ambulatory referral to Gastroenterology     Dale Chapin, MD

## 2022-10-06 ENCOUNTER — Encounter: Payer: Self-pay | Admitting: Internal Medicine

## 2022-10-06 NOTE — Assessment & Plan Note (Signed)
The 10-year ASCVD risk score (Arnett DK, et al., 2019) is: 5.1%   Values used to calculate the score:     Age: 66 years     Sex: Female     Is Non-Hispanic African American: No     Diabetic: No     Tobacco smoker: No     Systolic Blood Pressure: 112 mmHg     Is BP treated: No     HDL Cholesterol: 70.6 mg/dL     Total Cholesterol: 218 mg/dL  Low cholesterol diet and exercise.  Follow lipid panel.

## 2022-10-06 NOTE — Assessment & Plan Note (Addendum)
Describes fullness and abdomen feeling more firm in pm - notices more after eating evening meal.  Some soft stool.  Started benefiber.  Also epigastric discomfort after eating.  Will obtain cbc, metablolic panel, liver panel and lipase.  Check KUB to confirm no large stool burden.  Could not appreciate hernia on exam.  Discussed possible CT scan - pending above.  Also discussed GI referral.  

## 2022-10-06 NOTE — Assessment & Plan Note (Signed)
Describes fullness and abdomen feeling more firm in pm - notices more after eating evening meal.  Some soft stool.  Started benefiber.  Also epigastric discomfort after eating.  Will obtain cbc, metablolic panel, liver panel and lipase.  Check KUB to confirm no large stool burden.  Could not appreciate hernia on exam.  Discussed possible CT scan - pending above.  Also discussed GI referral.

## 2022-10-06 NOTE — Assessment & Plan Note (Signed)
Symptoms as outlined.  She is concerned regarding possible thyroid issues.  Check labs as outlined.  Sleeping ok.

## 2022-10-18 ENCOUNTER — Ambulatory Visit: Payer: Managed Care, Other (non HMO)

## 2022-11-30 ENCOUNTER — Encounter (INDEPENDENT_AMBULATORY_CARE_PROVIDER_SITE_OTHER): Payer: Self-pay

## 2022-12-08 ENCOUNTER — Other Ambulatory Visit: Payer: Self-pay | Admitting: Internal Medicine

## 2022-12-08 DIAGNOSIS — Z1231 Encounter for screening mammogram for malignant neoplasm of breast: Secondary | ICD-10-CM

## 2023-01-02 ENCOUNTER — Ambulatory Visit (INDEPENDENT_AMBULATORY_CARE_PROVIDER_SITE_OTHER): Payer: Managed Care, Other (non HMO) | Admitting: Internal Medicine

## 2023-01-02 ENCOUNTER — Encounter: Payer: Self-pay | Admitting: Internal Medicine

## 2023-01-02 VITALS — BP 110/72 | HR 74 | Temp 98.5°F | Ht 62.0 in | Wt 117.6 lb

## 2023-01-02 DIAGNOSIS — R198 Other specified symptoms and signs involving the digestive system and abdomen: Secondary | ICD-10-CM | POA: Diagnosis not present

## 2023-01-02 DIAGNOSIS — E7800 Pure hypercholesterolemia, unspecified: Secondary | ICD-10-CM

## 2023-01-02 DIAGNOSIS — Z0000 Encounter for general adult medical examination without abnormal findings: Secondary | ICD-10-CM | POA: Diagnosis not present

## 2023-01-02 MED ORDER — ESTROGENS CONJUGATED 0.625 MG/GM VA CREA: TOPICAL_CREAM | VAGINAL | 1 refills | Status: DC

## 2023-01-02 NOTE — Assessment & Plan Note (Addendum)
Physical today 01/02/23.  Mammogram 01/09/22 - Birads I.  Scheduled f/u mammogram - 01/11/23. PAP 12/2020 - atrophy changes.  Negative HPV. PAP - 12/28/21 - negative with negative HPV. Colonoscopy 03/2018.  Recommended f/u in 10 years.

## 2023-01-02 NOTE — Progress Notes (Signed)
Subjective:    Patient ID: Erika Wall, female    DOB: 01/19/1956, 67 y.o.   MRN: [id number]  Patient here for  Chief Complaint  Patient presents with   Annual Exam    HPI Here for a physical exam. Recently evaluated for fatigue, feeling cold and bowel change. Unremarkable KUB. CT scan ordered - abdominal fullness. She adjusted her diet.  Cut out dairy and wheat. Bowels are better.  Feels more normal.  No bloating.  No abdominal fullness. She is eating yogurt a few times per week.  No chest pain or sob reported.  Some allergy symptoms.  Controlling with allegra and steroid nasal spray.     Past Medical History:  Diagnosis Date   Arthritis    Past Surgical History:  Procedure Laterality Date   BREAST BIOPSY Right 12/20/1995   neg   Family History  Problem Relation Age of Onset   Cancer Mother 76       uterine   Stroke Mother    Cancer Father        facial sinus cancer   Arthritis Maternal Grandmother    Breast cancer Neg Hx    Social History   Socioeconomic History   Marital status: Married    Spouse name: Not on file   Number of children: Not on file   Years of education: Not on file   Highest education level: 12th grade  Occupational History   Occupation: Advertising account planner: [employer]  Tobacco Use   Smoking status: Never   Smokeless tobacco: Never  Substance and Sexual Activity   Alcohol use: No    Alcohol/week: 0.0 standard drinks of alcohol   Drug use: No   Sexual activity: Not on file  Other Topics Concern   Not on file  Social History Narrative   Not on file   Social Determinants of Health   Financial Resource Strain: Low Risk  (09/29/2022)   Overall Financial Resource Strain (CARDIA)    Difficulty of Paying Living Expenses: Not hard at all  Food Insecurity: No Food Insecurity (09/29/2022)   Hunger Vital Sign    Worried About Running Out of Food in the Last Year: Never true    Ran Out of Food in the Last Year: Never true   Transportation Needs: No Transportation Needs (09/29/2022)   PRAPARE - Administrator, Civil Service (Medical): No    Lack of Transportation (Non-Medical): No  Physical Activity: Sufficiently Active (09/29/2022)   Exercise Vital Sign    Days of Exercise per Week: 7 days    Minutes of Exercise per Session: 50 min  Stress: No Stress Concern Present (09/29/2022)   [employer] of Occupational Health - Occupational Stress Questionnaire    Feeling of Stress : Not at all  Social Connections: Socially Integrated (09/29/2022)   Social Connection and Isolation Panel [NHANES]    Frequency of Communication with Friends and Family: More than three times a week    Frequency of Social Gatherings with Friends and Family: More than three times a week    Attends Religious Services: More than 4 times per year    Active Member of [employer] or Organizations: Yes    Attends Engineer, structural: More than 4 times per year    Marital Status: Married     Review of Systems  Constitutional:  Negative for appetite change and unexpected weight change.  HENT:  Negative for congestion and sinus pressure.  Respiratory:  Negative for cough, chest tightness and shortness of breath.   Cardiovascular:  Negative for chest pain and palpitations.  Gastrointestinal:  Negative for abdominal pain, diarrhea, nausea and vomiting.  Genitourinary:  Negative for difficulty urinating and dysuria.  Musculoskeletal:  Negative for joint swelling and myalgias.  Skin:  Negative for color change and rash.  Neurological:  Negative for dizziness and headaches.  Psychiatric/Behavioral:  Negative for agitation and dysphoric mood.        Objective:     BP 110/72   Pulse 74   Temp 98.5 F (36.9 C) (Oral)   Ht 5\' 2"  (1.575 m)   Wt 117 lb 9.6 oz (53.3 kg)   LMP 04/17/2005   SpO2 96%   BMI 21.51 kg/m  Wt Readings from Last 3 Encounters:  01/02/23 117 lb 9.6 oz (53.3 kg)  10/05/22 118 lb 12.8 oz (53.9 kg)   12/28/21 116 lb (52.6 kg)    Physical Exam Vitals reviewed.  Constitutional:      General: She is not in acute distress.    Appearance: Normal appearance.  HENT:     Head: Normocephalic and atraumatic.     Right Ear: External ear normal.     Left Ear: External ear normal.  Eyes:     General: No scleral icterus.       Right eye: No discharge.        Left eye: No discharge.     Conjunctiva/sclera: Conjunctivae normal.  Neck:     Thyroid: No thyromegaly.  Cardiovascular:     Rate and Rhythm: Normal rate and regular rhythm.  Pulmonary:     Effort: No respiratory distress.     Breath sounds: Normal breath sounds. No wheezing.  Abdominal:     General: Bowel sounds are normal.     Palpations: Abdomen is soft.     Tenderness: There is no abdominal tenderness.  Musculoskeletal:        General: No swelling or tenderness.     Cervical back: Neck supple. No tenderness.  Lymphadenopathy:     Cervical: No cervical adenopathy.  Skin:    Findings: No erythema or rash.  Neurological:     Mental Status: She is alert.  Psychiatric:        Mood and Affect: Mood normal.        Behavior: Behavior normal.      Outpatient Encounter Medications as of 01/02/2023  Medication Sig   Biotin 1000 MCG tablet Take 1,000 mcg by mouth daily.   Cholecalciferol (VITAMIN D-3) 125 MCG (5000 UT) TABS Take by mouth.   COLLAGEN PO Take 2,500 mg by mouth.   conjugated estrogens (PREMARIN) vaginal cream Use pea size amount vaginally 3 days per week   Multiple Vitamin (MULTIVITAMIN) capsule Take 1 capsule by mouth daily.   triamcinolone (NASACORT) 55 MCG/ACT AERO nasal inhaler Place 2 sprays into the nose daily as needed.   vitamin B-12 (CYANOCOBALAMIN) 1000 MCG tablet Take 1,000 mcg by mouth daily.   [DISCONTINUED] conjugated estrogens (PREMARIN) vaginal cream Use pea size amount vaginally 3 days per week   No facility-administered encounter medications on file as of 01/02/2023.     Lab Results   Component Value Date   WBC 4.8 10/05/2022   HGB 13.1 10/05/2022   HCT 39.7 10/05/2022   PLT 260.0 10/05/2022   GLUCOSE 96 10/05/2022   CHOL 218 (H) 10/05/2022   TRIG 60.0 10/05/2022   HDL 70.60 10/05/2022   LDLDIRECT 119.2 10/31/2011  LDLCALC 136 (H) 10/05/2022   ALT 15 10/05/2022   AST 22 10/05/2022   NA 140 10/05/2022   K 4.2 10/05/2022   CL 106 10/05/2022   CREATININE 0.91 10/05/2022   BUN 20 10/05/2022   CO2 27 10/05/2022   TSH 1.87 10/05/2022    MM 3D SCREEN BREAST BILATERAL  Result Date: 01/10/2022 CLINICAL DATA:  Screening. EXAM: DIGITAL SCREENING BILATERAL MAMMOGRAM WITH TOMOSYNTHESIS AND CAD TECHNIQUE: Bilateral screening digital craniocaudal and mediolateral oblique mammograms were obtained. Bilateral screening digital breast tomosynthesis was performed. The images were evaluated with computer-aided detection. COMPARISON:  Previous exam(s). ACR Breast Density Category b: There are scattered areas of fibroglandular density. FINDINGS: There are no findings suspicious for malignancy. IMPRESSION: No mammographic evidence of malignancy. A result letter of this screening mammogram will be mailed directly to the patient. RECOMMENDATION: Screening mammogram in one year. (Code:SM-B-01Y) BI-RADS CATEGORY  1: Negative. Electronically Signed   By: Edwin Cap M.D.   On: 01/10/2022 08:50       Assessment & Plan:  Routine general medical examination at a health care facility  Healthcare maintenance Assessment & Plan: Physical today 01/02/23.  Mammogram 01/09/22 - [facility] I.  Scheduled f/u mammogram - 01/11/23. PAP 12/2020 - atrophy changes.  Negative HPV. PAP - 12/28/21 - negative with negative HPV. Colonoscopy 03/2018.  Recommended f/u in 10 years.    Hypercholesterolemia Assessment & Plan: The 10-year ASCVD risk score (Arnett DK, et al., 2019) is: 5.5%   Values used to calculate the score:     Age: 44 years     Sex: Female     Is Non-Hispanic African American: No      Diabetic: No     Tobacco smoker: No     Systolic Blood Pressure: 110 mmHg     Is BP treated: No     HDL Cholesterol: 70.6 mg/dL     Total Cholesterol: 218 mg/dL  Low cholesterol diet and exercise.  Follow lipid panel.    Orders: -     Lipid panel; Future -     Hepatic function panel; Future -     Basic metabolic panel; Future  Abdominal fullness Assessment & Plan: Has adjusted diet as outlined.  No abdominal fullness or bloating.  Bowels moving.  Overall feels at her normal.    Other orders -     Estrogens Conjugated; Use pea size amount vaginally 3 days per week  Dispense: 42.5 g; Refill: 1     Dale Culpeper, MD

## 2023-01-02 NOTE — Assessment & Plan Note (Signed)
Has adjusted diet as outlined.  No abdominal fullness or bloating.  Bowels moving.  Overall feels at her normal.

## 2023-01-02 NOTE — Assessment & Plan Note (Signed)
The 10-year ASCVD risk score (Arnett DK, et al., 2019) is: 5.5%   Values used to calculate the score:     Age: 67 years     Sex: Female     Is Non-Hispanic African American: No     Diabetic: No     Tobacco smoker: No     Systolic Blood Pressure: 110 mmHg     Is BP treated: No     HDL Cholesterol: 70.6 mg/dL     Total Cholesterol: 218 mg/dL  Low cholesterol diet and exercise.  Follow lipid panel.

## 2023-01-16 ENCOUNTER — Ambulatory Visit
Admission: RE | Admit: 2023-01-16 | Discharge: 2023-01-16 | Disposition: A | Discharge status: Home / Self Care | Payer: Managed Care, Other (non HMO) | Source: Ambulatory Visit | Attending: Internal Medicine | Admitting: Internal Medicine

## 2023-01-16 DIAGNOSIS — Z1231 Encounter for screening mammogram for malignant neoplasm of breast: Secondary | ICD-10-CM | POA: Diagnosis present

## 2024-01-02 ENCOUNTER — Other Ambulatory Visit (INDEPENDENT_AMBULATORY_CARE_PROVIDER_SITE_OTHER): Payer: Managed Care, Other (non HMO)

## 2024-01-02 ENCOUNTER — Ambulatory Visit: Payer: Self-pay | Admitting: Internal Medicine

## 2024-01-02 DIAGNOSIS — E7800 Pure hypercholesterolemia, unspecified: Secondary | ICD-10-CM

## 2024-01-02 LAB — BASIC METABOLIC PANEL WITH GFR
BUN: 20 mg/dL (ref 6–23)
CO2: 30 meq/L (ref 19–32)
Calcium: 9 mg/dL (ref 8.4–10.5)
Chloride: 106 meq/L (ref 96–112)
Creatinine, Ser: 0.82 mg/dL (ref 0.40–1.20)
GFR: 73.18 mL/min (ref >60.00)
Glucose, Bld: 86 mg/dL (ref 70–99)
Potassium: 3.9 meq/L (ref 3.5–5.1)
Sodium: 141 meq/L (ref 135–145)

## 2024-01-02 LAB — HEPATIC FUNCTION PANEL
ALT: 16 U/L (ref 0–35)
AST: 21 U/L (ref 0–37)
Albumin: 4.3 g/dL (ref 3.5–5.2)
Alkaline Phosphatase: 48 U/L (ref 39–117)
Bilirubin, Direct: 0.1 mg/dL (ref 0.0–0.3)
Total Bilirubin: 0.6 mg/dL (ref 0.2–1.2)
Total Protein: 6.3 g/dL (ref 6.0–8.3)

## 2024-01-02 LAB — LIPID PANEL
Cholesterol: 218 mg/dL — ABNORMAL HIGH (ref 0–200)
HDL: 71.4 mg/dL (ref >39.00)
LDL Cholesterol: 129 mg/dL — ABNORMAL HIGH (ref 0–99)
NonHDL: 146.49
Total CHOL/HDL Ratio: 3
Triglycerides: 89 mg/dL (ref 0.0–149.0)
VLDL: 17.8 mg/dL (ref 0.0–40.0)

## 2024-01-04 ENCOUNTER — Encounter: Payer: Self-pay | Admitting: Internal Medicine

## 2024-01-04 ENCOUNTER — Ambulatory Visit (INDEPENDENT_AMBULATORY_CARE_PROVIDER_SITE_OTHER): Payer: Managed Care, Other (non HMO) | Admitting: Internal Medicine

## 2024-01-04 VITALS — BP 120/70 | HR 68 | Resp 16 | Ht 62.25 in | Wt 117.6 lb

## 2024-01-04 DIAGNOSIS — M8580 Other specified disorders of bone density and structure, unspecified site: Secondary | ICD-10-CM | POA: Diagnosis not present

## 2024-01-04 DIAGNOSIS — R5383 Other fatigue: Secondary | ICD-10-CM

## 2024-01-04 DIAGNOSIS — E2839 Other primary ovarian failure: Secondary | ICD-10-CM

## 2024-01-04 DIAGNOSIS — Z0000 Encounter for general adult medical examination without abnormal findings: Secondary | ICD-10-CM | POA: Diagnosis not present

## 2024-01-04 DIAGNOSIS — R0989 Other specified symptoms and signs involving the circulatory and respiratory systems: Secondary | ICD-10-CM | POA: Diagnosis not present

## 2024-01-04 DIAGNOSIS — Z136 Encounter for screening for cardiovascular disorders: Secondary | ICD-10-CM

## 2024-01-04 DIAGNOSIS — Z1231 Encounter for screening mammogram for malignant neoplasm of breast: Secondary | ICD-10-CM

## 2024-01-04 DIAGNOSIS — E7800 Pure hypercholesterolemia, unspecified: Secondary | ICD-10-CM | POA: Diagnosis not present

## 2024-01-04 DIAGNOSIS — R109 Unspecified abdominal pain: Secondary | ICD-10-CM

## 2024-01-04 DIAGNOSIS — R319 Hematuria, unspecified: Secondary | ICD-10-CM

## 2024-01-04 MED ORDER — ESTROGENS CONJUGATED 0.625 MG/GM VA CREA: TOPICAL_CREAM | VAGINAL | 1 refills | Status: AC

## 2024-01-04 NOTE — Assessment & Plan Note (Signed)
 The 10-year ASCVD risk score (Arnett DK, et al., 2019) is: 7.3%   Values used to calculate the score:     Age: 68 years     Clincally relevant sex: Female     Is Non-Hispanic African American: No     Diabetic: No     Tobacco smoker: No     Systolic Blood Pressure: 120 mmHg     Is BP treated: No     HDL Cholesterol: 71.4 mg/dL     Total Cholesterol: 218 mg/dL  Low cholesterol diet and exercise. Discussed recent labs. Check calcium score.

## 2024-01-04 NOTE — Assessment & Plan Note (Signed)
 Check carotid ultrasound.

## 2024-01-04 NOTE — Assessment & Plan Note (Addendum)
 Physical today 01/04/24.  Mammogram 01/16/23- Birads I.  Schedule f/u mammogram. PAP 12/2020 - atrophy changes.  Negative HPV. PAP - 12/28/21 - negative with negative HPV. Colonoscopy 03/2018.  Recommended f/u in 10 years. Schedule bone denstiy.

## 2024-01-04 NOTE — Assessment & Plan Note (Signed)
 Check bone density.

## 2024-01-04 NOTE — Assessment & Plan Note (Signed)
 No increased pain now. Overall improved since monitoring her diet. Request referral to an allergist.

## 2024-01-04 NOTE — Progress Notes (Signed)
 Subjective:    Patient ID: Erika Wall, female    DOB: [birth year], 68 y.o.   MRN: [id number]  Patient here for  Chief Complaint  Patient presents with   Annual Exam    HPI Here for a physical exam. She is doing well. Feels good. Stays active. No chest pain or sob reported. No abdominal pain. Her bowels are doing better if she monitors her diet. She does request seeing an allergist - for allergy  testing - with bowel issues.    Past Medical History:  Diagnosis Date   Arthritis    Past Surgical History:  Procedure Laterality Date   BREAST BIOPSY Right 12/20/1995   neg   Family History  Problem Relation Age of Onset   Cancer Mother 47       uterine   Stroke Mother    Cancer Father        facial sinus cancer   Arthritis Maternal Grandmother    Breast cancer Neg Hx    Social History   Socioeconomic History   Marital status: Married    Spouse name: Not on file   Number of children: Not on file   Years of education: Not on file   Highest education level: 12th grade  Occupational History   Occupation: Advertising account planner: [employer]  Tobacco Use   Smoking status: Never   Smokeless tobacco: Never  Substance and Sexual Activity   Alcohol use: No    Alcohol/week: 0.0 standard drinks of alcohol   Drug use: No   Sexual activity: Not on file  Other Topics Concern   Not on file  Social History Narrative   Not on file   Social Drivers of Health   Financial Resource Strain: Low Risk  (12/31/2023)   Overall Financial Resource Strain (CARDIA)    Difficulty of Paying Living Expenses: Not hard at all  Food Insecurity: No Food Insecurity (12/31/2023)   Hunger Vital Sign    Worried About Running Out of Food in the Last Year: Never true    Ran Out of Food in the Last Year: Never true  Transportation Needs: No Transportation Needs (12/31/2023)   PRAPARE - Administrator, Civil Service (Medical): No    Lack of Transportation (Non-Medical):  No  Physical Activity: Sufficiently Active (12/31/2023)   Exercise Vital Sign    Days of Exercise per Week: 6 days    Minutes of Exercise per Session: 70 min  Stress: No Stress Concern Present (12/31/2023)   [employer] of Occupational Health - Occupational Stress Questionnaire    Feeling of Stress: Not at all  Social Connections: Moderately Integrated (12/31/2023)   Social Connection and Isolation Panel    Frequency of Communication with Friends and Family: More than three times a week    Frequency of Social Gatherings with Friends and Family: More than three times a week    Attends Religious Services: More than 4 times per year    Active Member of [employer] or Organizations: No    Attends Engineer, structural: Not on file    Marital Status: Married     Review of Systems  Constitutional:  Negative for appetite change and unexpected weight change.  HENT:  Negative for congestion, sinus pressure and sore throat.   Eyes:  Negative for pain and visual disturbance.  Respiratory:  Negative for cough, chest tightness and shortness of breath.   Cardiovascular:  Negative for chest pain, palpitations and leg  swelling.  Gastrointestinal:  Negative for abdominal pain, diarrhea, nausea and vomiting.  Genitourinary:  Negative for difficulty urinating and dysuria.  Musculoskeletal:  Negative for joint swelling and myalgias.  Skin:  Negative for color change and rash.  Neurological:  Negative for dizziness and headaches.  Hematological:  Negative for adenopathy. Does not bruise/bleed easily.  Psychiatric/Behavioral:  Negative for agitation and dysphoric mood.        Objective:     BP 120/70   Pulse 68   Resp 16   Ht 5' 2.25 (1.581 m)   Wt 117 lb 9.6 oz (53.3 kg)   LMP 04/17/2005   SpO2 98%   BMI 21.34 kg/m  Wt Readings from Last 3 Encounters:  01/04/24 117 lb 9.6 oz (53.3 kg)  01/02/23 117 lb 9.6 oz (53.3 kg)  10/05/22 118 lb 12.8 oz (53.9 kg)    Physical Exam Vitals  reviewed.  Constitutional:      General: She is not in acute distress.    Appearance: Normal appearance. She is well-developed.  HENT:     Head: Normocephalic and atraumatic.     Right Ear: External ear normal.     Left Ear: External ear normal.     Mouth/Throat:     Pharynx: No oropharyngeal exudate or posterior oropharyngeal erythema.  Eyes:     General: No scleral icterus.       Right eye: No discharge.        Left eye: No discharge.     Conjunctiva/sclera: Conjunctivae normal.  Neck:     Thyroid : No thyromegaly.  Cardiovascular:     Rate and Rhythm: Normal rate and regular rhythm.     Comments: Left carotid bruit.  Pulmonary:     Effort: No tachypnea, accessory muscle usage or respiratory distress.     Breath sounds: Normal breath sounds. No decreased breath sounds or wheezing.  Chest:  Breasts:    Right: No inverted nipple, mass, nipple discharge or tenderness (no axillary adenopathy).     Left: No inverted nipple, mass, nipple discharge or tenderness (no axilarry adenopathy).  Abdominal:     General: Bowel sounds are normal.     Palpations: Abdomen is soft.     Tenderness: There is no abdominal tenderness.  Musculoskeletal:        General: No swelling or tenderness.     Cervical back: Neck supple.  Lymphadenopathy:     Cervical: No cervical adenopathy.  Skin:    Findings: No erythema or rash.  Neurological:     Mental Status: She is alert and oriented to person, place, and time.  Psychiatric:        Mood and Affect: Mood normal.        Behavior: Behavior normal.         Outpatient Encounter Medications as of 01/04/2024  Medication Sig   Biotin 1000 MCG tablet Take 1,000 mcg by mouth daily.   Cholecalciferol (VITAMIN D -3) 125 MCG (5000 UT) TABS Take by mouth.   COLLAGEN PO Take 2,500 mg by mouth.   conjugated estrogens  (PREMARIN ) vaginal cream Use pea size amount vaginally 2 days per week   Multiple Vitamin (MULTIVITAMIN) capsule Take 1 capsule by mouth  daily.   triamcinolone (NASACORT) 55 MCG/ACT AERO nasal inhaler Place 2 sprays into the nose daily as needed.   vitamin B-12 (CYANOCOBALAMIN) 1000 MCG tablet Take 1,000 mcg by mouth daily.   [DISCONTINUED] conjugated estrogens  (PREMARIN ) vaginal cream Use pea size amount vaginally 3 days per week  No facility-administered encounter medications on file as of 01/04/2024.     Lab Results  Component Value Date   WBC 4.8 10/05/2022   HGB 13.1 10/05/2022   HCT 39.7 10/05/2022   PLT 260.0 10/05/2022   GLUCOSE 86 01/02/2024   CHOL 218 (H) 01/02/2024   TRIG 89.0 01/02/2024   HDL 71.40 01/02/2024   LDLDIRECT 119.2 10/31/2011   LDLCALC 129 (H) 01/02/2024   ALT 16 01/02/2024   AST 21 01/02/2024   NA 141 01/02/2024   K 3.9 01/02/2024   CL 106 01/02/2024   CREATININE 0.82 01/02/2024   BUN 20 01/02/2024   CO2 30 01/02/2024   TSH 1.87 10/05/2022    MM 3D SCREENING MAMMOGRAM BILATERAL BREAST Result Date: 01/17/2023 CLINICAL DATA:  Screening. EXAM: DIGITAL SCREENING BILATERAL MAMMOGRAM WITH TOMOSYNTHESIS AND CAD TECHNIQUE: Bilateral screening digital craniocaudal and mediolateral oblique mammograms were obtained. Bilateral screening digital breast tomosynthesis was performed. The images were evaluated with computer-aided detection. COMPARISON:  Previous exam(s). ACR Breast Density Category b: There are scattered areas of fibroglandular density. FINDINGS: There are no findings suspicious for malignancy. IMPRESSION: No mammographic evidence of malignancy. A result letter of this screening mammogram will be mailed directly to the patient. RECOMMENDATION: Screening mammogram in one year. (Code:SM-B-01Y) BI-RADS CATEGORY  1: Negative. Electronically Signed   By: Debby Satterfield M.D.   On: 01/17/2023 15:00       Assessment & Plan:  Routine general medical examination at a health care facility  Hypercholesterolemia Assessment & Plan: The 10-year ASCVD risk score (Arnett DK, et al., 2019) is: 7.3%    Values used to calculate the score:     Age: 37 years     Clincally relevant sex: Female     Is Non-Hispanic African American: No     Diabetic: No     Tobacco smoker: No     Systolic Blood Pressure: 120 mmHg     Is BP treated: No     HDL Cholesterol: 71.4 mg/dL     Total Cholesterol: 218 mg/dL  Low cholesterol diet and exercise. Discussed recent labs. Check calcium score.   Orders: -     Basic metabolic panel with GFR; Future -     CBC with Differential/Platelet; Future -     Hepatic function panel; Future -     Lipid panel; Future -     TSH; Future  Healthcare maintenance Assessment & Plan: Physical today 01/04/24.  Mammogram 01/16/23- Birads I.  Schedule f/u mammogram. PAP 12/2020 - atrophy changes.  Negative HPV. PAP - 12/28/21 - negative with negative HPV. Colonoscopy 03/2018.  Recommended f/u in 10 years. Schedule bone denstiy.    Encounter for screening mammogram for malignant neoplasm of breast -     3D Screening Mammogram, Left and Right; Future  Estrogen deficiency -     DG Bone Density; Future  Osteopenia, unspecified location Assessment & Plan: Check bone density.    Left carotid bruit Assessment & Plan: Check carotid ultrasound.    Abdominal discomfort Assessment & Plan: No increased pain now. Overall improved since monitoring her diet. Request referral to an allergist.   Orders: -     Ambulatory referral to Allergy   Encounter for screening for coronary artery disease Assessment & Plan: Discussed cholesterol results. Discussed statin medication. Discussed CT calcium score - for screening for CAD.   Orders: -     CT CARDIAC SCORING (SELF PAY ONLY); Future  Hematuria, unspecified type Assessment & Plan: Worked up by urology.  Will check urinalysis with next labs.   Orders: -     Urinalysis, Routine w reflex microscopic; Future  Other orders -     Estrogens  Conjugated ; Use pea size amount vaginally 2 days per week  Dispense: 42.5 g; Refill:  1     Allena Hamilton, MD

## 2024-01-05 ENCOUNTER — Encounter: Payer: Self-pay | Admitting: Internal Medicine

## 2024-01-05 NOTE — Assessment & Plan Note (Signed)
 Worked up by urology. Will check urinalysis with next labs.

## 2024-01-05 NOTE — Assessment & Plan Note (Signed)
 Discussed cholesterol results. Discussed statin medication. Discussed CT calcium score - for screening for CAD.

## 2024-01-08 ENCOUNTER — Ambulatory Visit: Payer: Self-pay | Admitting: Internal Medicine

## 2024-01-08 ENCOUNTER — Ambulatory Visit
Admission: RE | Admit: 2024-01-08 | Discharge: 2024-01-08 | Disposition: A | Discharge status: Home / Self Care | Payer: Self-pay | Source: Ambulatory Visit | Attending: Internal Medicine | Admitting: Internal Medicine

## 2024-01-08 DIAGNOSIS — Z136 Encounter for screening for cardiovascular disorders: Secondary | ICD-10-CM | POA: Insufficient documentation

## 2024-01-31 ENCOUNTER — Ambulatory Visit
Admission: RE | Admit: 2024-01-31 | Discharge: 2024-01-31 | Disposition: A | Discharge status: Home / Self Care | Source: Ambulatory Visit | Attending: Internal Medicine | Admitting: Internal Medicine

## 2024-01-31 DIAGNOSIS — Z1231 Encounter for screening mammogram for malignant neoplasm of breast: Secondary | ICD-10-CM

## 2024-01-31 DIAGNOSIS — Z780 Asymptomatic menopausal state: Secondary | ICD-10-CM | POA: Diagnosis not present

## 2024-01-31 DIAGNOSIS — E2839 Other primary ovarian failure: Secondary | ICD-10-CM | POA: Insufficient documentation

## 2024-01-31 DIAGNOSIS — M85832 Other specified disorders of bone density and structure, left forearm: Secondary | ICD-10-CM | POA: Diagnosis not present

## 2024-01-31 DIAGNOSIS — M810 Age-related osteoporosis without current pathological fracture: Secondary | ICD-10-CM | POA: Diagnosis not present

## 2024-02-25 ENCOUNTER — Ambulatory Visit (INDEPENDENT_AMBULATORY_CARE_PROVIDER_SITE_OTHER): Admitting: Internal Medicine

## 2024-02-25 VITALS — BP 126/72 | HR 76 | Temp 98.0°F | Ht 62.5 in | Wt 122.0 lb

## 2024-02-25 DIAGNOSIS — M810 Age-related osteoporosis without current pathological fracture: Secondary | ICD-10-CM | POA: Diagnosis not present

## 2024-02-25 NOTE — Progress Notes (Signed)
 Subjective:    Patient ID: Erika Wall, female    DOB: [birth year], 68 y.o.   MRN: [id number]  Patient here for  Chief Complaint  Patient presents with   Medical Management of Chronic Issues    Treatment options    HPI Here to discuss recent bone density results and treatment options.  Bone density revealed osteoporosis left femoral neck (-2.5) with total left hip (-1.6). right femoral neck (-2.4) with total right hip (-1.4). L-S spine (-1.6) and radius (-1.6). discussed treatment options - including oral bisphosphonates, reclast and prolia. Discussed calcium and vitamin d  supplements. Continue weight beating exercise.    Past Medical History:  Diagnosis Date   Arthritis    Past Surgical History:  Procedure Laterality Date   BREAST BIOPSY Right 12/20/1995   neg   Family History  Problem Relation Age of Onset   Cancer Mother 63       uterine   Stroke Mother    Cancer Father        facial sinus cancer   Arthritis Maternal Grandmother    Breast cancer Neg Hx    Social History   Socioeconomic History   Marital status: Married    Spouse name: Not on file   Number of children: Not on file   Years of education: Not on file   Highest education level: 12th grade  Occupational History   Occupation: Advertising Account Planner: [employer]  Tobacco Use   Smoking status: Never   Smokeless tobacco: Never  Substance and Sexual Activity   Alcohol use: No    Alcohol/week: 0.0 standard drinks of alcohol   Drug use: No   Sexual activity: Not on file  Other Topics Concern   Not on file  Social History Narrative   Not on file   Social Drivers of Health   Financial Resource Strain: Low Risk  (02/25/2024)   Overall Financial Resource Strain (CARDIA)    Difficulty of Paying Living Expenses: Not hard at all  Food Insecurity: No Food Insecurity (02/25/2024)   Hunger Vital Sign    Worried About Running Out of Food in the Last Year: Never true    Ran Out of Food  in the Last Year: Never true  Transportation Needs: No Transportation Needs (02/25/2024)   PRAPARE - Administrator, Civil Service (Medical): No    Lack of Transportation (Non-Medical): No  Physical Activity: Sufficiently Active (02/25/2024)   Exercise Vital Sign    Days of Exercise per Week: 5 days    Minutes of Exercise per Session: 150+ min  Stress: No Stress Concern Present (02/25/2024)   [employer] of Occupational Health - Occupational Stress Questionnaire    Feeling of Stress: Not at all  Social Connections: Socially Integrated (02/25/2024)   Social Connection and Isolation Panel    Frequency of Communication with Friends and Family: More than three times a week    Frequency of Social Gatherings with Friends and Family: More than three times a week    Attends Religious Services: More than 4 times per year    Active Member of [employer] or Organizations: Yes    Attends Engineer, Structural: More than 4 times per year    Marital Status: Married     Review of Systems  Constitutional:  Negative for appetite change and unexpected weight change.  HENT:  Negative for congestion and sinus pressure.   Respiratory:  Negative for cough, chest tightness and shortness  of breath.   Cardiovascular:  Negative for chest pain, palpitations and leg swelling.  Gastrointestinal:  Negative for abdominal pain, diarrhea, nausea and vomiting.  Genitourinary:  Negative for difficulty urinating and dysuria.  Musculoskeletal:  Negative for joint swelling and myalgias.  Skin:  Negative for color change and rash.  Neurological:  Negative for dizziness and headaches.  Psychiatric/Behavioral:  Negative for agitation and dysphoric mood.        Objective:     BP 126/72   Pulse 76   Temp 98 F (36.7 C) (Oral)   Ht 5' 2.5 (1.588 m)   Wt 122 lb (55.3 kg)   LMP 04/17/2005   SpO2 96%   BMI 21.96 kg/m  Wt Readings from Last 3 Encounters:  02/25/24 122 lb (55.3 kg)  01/04/24  117 lb 9.6 oz (53.3 kg)  01/02/23 117 lb 9.6 oz (53.3 kg)    Physical Exam Vitals reviewed.  Constitutional:      General: She is not in acute distress.    Appearance: Normal appearance.  HENT:     Head: Normocephalic and atraumatic.     Right Ear: External ear normal.     Left Ear: External ear normal.     Mouth/Throat:     Pharynx: No oropharyngeal exudate or posterior oropharyngeal erythema.  Eyes:     General: No scleral icterus.       Right eye: No discharge.        Left eye: No discharge.     Conjunctiva/sclera: Conjunctivae normal.  Neck:     Thyroid : No thyromegaly.  Cardiovascular:     Rate and Rhythm: Normal rate and regular rhythm.  Pulmonary:     Effort: No respiratory distress.     Breath sounds: Normal breath sounds. No wheezing.  Abdominal:     General: Bowel sounds are normal.     Palpations: Abdomen is soft.     Tenderness: There is no abdominal tenderness.  Musculoskeletal:        General: No swelling or tenderness.     Cervical back: Neck supple. No tenderness.  Lymphadenopathy:     Cervical: No cervical adenopathy.  Skin:    Findings: No erythema or rash.  Neurological:     Mental Status: She is alert.  Psychiatric:        Mood and Affect: Mood normal.        Behavior: Behavior normal.         Outpatient Encounter Medications as of 02/25/2024  Medication Sig   Biotin 1000 MCG tablet Take 1,000 mcg by mouth daily.   Cholecalciferol (VITAMIN D -3) 125 MCG (5000 UT) TABS Take by mouth.   COLLAGEN PO Take 2,500 mg by mouth.   conjugated estrogens  (PREMARIN ) vaginal cream Use pea size amount vaginally 2 days per week   Multiple Vitamin (MULTIVITAMIN) capsule Take 1 capsule by mouth daily.   vitamin B-12 (CYANOCOBALAMIN) 1000 MCG tablet Take 1,000 mcg by mouth daily.   [DISCONTINUED] triamcinolone (NASACORT) 55 MCG/ACT AERO nasal inhaler Place 2 sprays into the nose daily as needed.   No facility-administered encounter medications on file as of  02/25/2024.     Lab Results  Component Value Date   WBC 4.8 10/05/2022   HGB 13.1 10/05/2022   HCT 39.7 10/05/2022   PLT 260.0 10/05/2022   GLUCOSE 86 01/02/2024   CHOL 218 (H) 01/02/2024   TRIG 89.0 01/02/2024   HDL 71.40 01/02/2024   LDLDIRECT 119.2 10/31/2011   LDLCALC 129 (H) 01/02/2024  ALT 16 01/02/2024   AST 21 01/02/2024   NA 141 01/02/2024   K 3.9 01/02/2024   CL 106 01/02/2024   CREATININE 0.82 01/02/2024   BUN 20 01/02/2024   CO2 30 01/02/2024   TSH 1.87 10/05/2022    MM 3D SCREENING MAMMOGRAM BILATERAL BREAST Result Date: 02/05/2024 CLINICAL DATA:  Screening. EXAM: DIGITAL SCREENING BILATERAL MAMMOGRAM WITH TOMOSYNTHESIS AND CAD TECHNIQUE: Bilateral screening digital craniocaudal and mediolateral oblique mammograms were obtained. Bilateral screening digital breast tomosynthesis was performed. The images were evaluated with computer-aided detection. COMPARISON:  Previous exam(s). ACR Breast Density Category b: There are scattered areas of fibroglandular density. FINDINGS: There are no findings suspicious for malignancy. IMPRESSION: No mammographic evidence of malignancy. A result letter of this screening mammogram will be mailed directly to the patient. RECOMMENDATION: Screening mammogram in one year. (Code:SM-B-01Y) BI-RADS CATEGORY  1: Negative. Electronically Signed   By: Dina  Arceo M.D.   On: 02/05/2024 08:13   DG Bone Density Result Date: 01/31/2024 EXAM: DUAL X-RAY ABSORPTIOMETRY (DXA) FOR BONE MINERAL DENSITY 01/31/2024 10:51 am CLINICAL DATA:  68 year old Female Postmenopausal. Estrogen deficiency TECHNIQUE: An axial (e.g., hips, spine) and/or appendicular (e.g., radius) exam was performed, as appropriate, using GE Secretary/administrator at [facility]. Images are obtained for bone mineral density measurement and are not obtained for diagnostic purposes. [id number] Exclusions: None. COMPARISON:  None. New baseline. FINDINGS: Scan quality:  Good. LUMBAR SPINE (L1-L4): BMD (in g/cm2): 0.995 T-score: -1.6 Z-score: 0.0 LEFT FEMORAL NECK: BMD (in g/cm2): 0.690 T-score: -2.5 Z-score: -0.9 LEFT TOTAL HIP: BMD (in g/cm2): 0.801 T-score: -1.6 Z-score: -0.2 RIGHT FEMORAL NECK: BMD (in g/cm2): 0.701 T-score: -2.4 Z-score: -0.8 RIGHT TOTAL HIP: BMD (in g/cm2): 0.832 T-score: -1.4 Z-score: 0.0 LEFT FOREARM (RADIUS 33%): BMD (in g/cm2): 0.740 T-score: -1.6 Z-score: 0.2 FRAX 10-YEAR PROBABILITY OF FRACTURE: FRAX not reported as the lowest BMD is not in the osteopenia range. IMPRESSION: Osteoporosis based on BMD. Fracture risk is increased. Increased risk is based on low BMD. RECOMMENDATIONS: 1. All patients should optimize calcium and vitamin D  intake. 2. Consider FDA-approved medical therapies in postmenopausal women and men aged 37 years and older, based on the following: - A hip or vertebral (clinical or morphometric) fracture - T-score less than or equal to -2.5 and secondary causes have been excluded. - Low bone mass (T-score between -1.0 and -2.5) and a 10-year probability of a hip fracture greater than or equal to 3% or a 10-year probability of a major osteoporosis-related fracture greater than or equal to 20% based on the [place] -adapted WHO algorithm. - Clinician judgment and/or patient preferences may indicate treatment for people with 10-year fracture probabilities above or below these levels 3. Patients with diagnosis of osteoporosis or at high risk for fracture should have regular bone mineral density tests. For patients eligible for Medicare, routine testing is allowed once every 2 years. The testing frequency can be increased to one year for patients who have rapidly progressing disease, those who are receiving or discontinuing medical therapy to restore bone mass, or have additional risk factors. Electronically Signed   By: Dina  Arceo M.D.   On: 01/31/2024 11:05       Assessment & Plan:  Osteoporosis, unspecified osteoporosis type, unspecified  pathological fracture presence Assessment & Plan: Discussed bone density results and treatment as outlined. Discussed calcium, vitamin d  and weight bearing exercise. Discussed prescription medications. Will notify me if desires to start prescription medication.       Roarke Marciano  [place], MD

## 2024-03-02 ENCOUNTER — Encounter: Payer: Self-pay | Admitting: Internal Medicine

## 2024-03-02 DIAGNOSIS — M810 Age-related osteoporosis without current pathological fracture: Secondary | ICD-10-CM | POA: Insufficient documentation

## 2024-03-02 NOTE — Assessment & Plan Note (Signed)
 Discussed bone density results and treatment as outlined. Discussed calcium, vitamin d  and weight bearing exercise. Discussed prescription medications. Will notify me if desires to start prescription medication.

## 2024-03-27 ENCOUNTER — Telehealth: Payer: Self-pay

## 2024-03-27 NOTE — Telephone Encounter (Signed)
 I left voicemail for patient asking her to please call us  back at [phone].  I also sent a message to patient via MyChart.  E2C2 - when patient calls back, please assist her with scheduling her Welcome to Medicare appointment with Dr. Allena Hamilton.  This is a one hour appointment that must take place prior to 10/15/2024.

## 2025-01-05 ENCOUNTER — Other Ambulatory Visit

## 2025-01-08 ENCOUNTER — Encounter: Admitting: Internal Medicine
